# Patient Record
Sex: Male | Born: 1945 | Race: White | Hispanic: No | Smoking: Former smoker
Health system: Southern US, Community
[De-identification: ages and names within clinical notes are randomized; demographics above are authoritative.]

## PROBLEM LIST (undated history)

## (undated) DIAGNOSIS — K219 Gastro-esophageal reflux disease without esophagitis: Secondary | ICD-10-CM

## (undated) DIAGNOSIS — N400 Enlarged prostate without lower urinary tract symptoms: Secondary | ICD-10-CM

## (undated) DIAGNOSIS — Z9981 Dependence on supplemental oxygen: Secondary | ICD-10-CM

## (undated) DIAGNOSIS — I25810 Atherosclerosis of coronary artery bypass graft(s) without angina pectoris: Secondary | ICD-10-CM

## (undated) DIAGNOSIS — R69 Illness, unspecified: Secondary | ICD-10-CM

## (undated) DIAGNOSIS — I2510 Atherosclerotic heart disease of native coronary artery without angina pectoris: Secondary | ICD-10-CM

## (undated) DIAGNOSIS — R911 Solitary pulmonary nodule: Secondary | ICD-10-CM

## (undated) DIAGNOSIS — I10 Essential (primary) hypertension: Secondary | ICD-10-CM

## (undated) DIAGNOSIS — J449 Chronic obstructive pulmonary disease, unspecified: Secondary | ICD-10-CM

## (undated) DIAGNOSIS — F411 Generalized anxiety disorder: Secondary | ICD-10-CM

## (undated) DIAGNOSIS — I639 Cerebral infarction, unspecified: Secondary | ICD-10-CM

## (undated) DIAGNOSIS — I4891 Unspecified atrial fibrillation: Secondary | ICD-10-CM

## (undated) HISTORY — DX: Cerebral infarction, unspecified: I63.9

## (undated) HISTORY — DX: Atherosclerotic heart disease of native coronary artery without angina pectoris: I25.10

## (undated) HISTORY — DX: Dependence on supplemental oxygen: Z99.81

## (undated) HISTORY — DX: Gastro-esophageal reflux disease without esophagitis: K21.9

## (undated) HISTORY — DX: Essential (primary) hypertension: I10

## (undated) HISTORY — DX: Benign prostatic hyperplasia without lower urinary tract symptoms: N40.0

---

## 1994-08-11 HISTORY — PX: CORONARY ARTERY BYPASS GRAFT: SHX141

## 1999-08-12 HISTORY — PX: ARTHRODESIS: SHX136

## 2000-02-14 ENCOUNTER — Encounter: Payer: Self-pay | Admitting: Neurological Surgery

## 2000-02-18 ENCOUNTER — Inpatient Hospital Stay (HOSPITAL_COMMUNITY): Admission: RE | Admit: 2000-02-18 | Discharge: 2000-02-19 | Payer: Self-pay | Admitting: Neurological Surgery

## 2000-02-18 ENCOUNTER — Encounter: Payer: Self-pay | Admitting: Neurological Surgery

## 2000-03-12 ENCOUNTER — Encounter: Payer: Self-pay | Admitting: Neurological Surgery

## 2000-03-12 ENCOUNTER — Encounter: Admission: RE | Admit: 2000-03-12 | Discharge: 2000-03-12 | Payer: Self-pay | Admitting: Neurological Surgery

## 2000-04-17 ENCOUNTER — Encounter: Payer: Self-pay | Admitting: Neurological Surgery

## 2000-04-17 ENCOUNTER — Encounter: Admission: RE | Admit: 2000-04-17 | Discharge: 2000-04-17 | Payer: Self-pay | Admitting: Neurological Surgery

## 2000-08-11 HISTORY — PX: CORONARY ANGIOPLASTY: SHX604

## 2000-12-10 ENCOUNTER — Encounter: Payer: Self-pay | Admitting: Cardiology

## 2000-12-10 ENCOUNTER — Ambulatory Visit (HOSPITAL_COMMUNITY): Admission: RE | Admit: 2000-12-10 | Discharge: 2000-12-10 | Payer: Self-pay | Admitting: Family Medicine

## 2000-12-17 ENCOUNTER — Inpatient Hospital Stay (HOSPITAL_COMMUNITY): Admission: RE | Admit: 2000-12-17 | Discharge: 2000-12-24 | Payer: Self-pay | Admitting: *Deleted

## 2000-12-17 ENCOUNTER — Encounter: Payer: Self-pay | Admitting: *Deleted

## 2000-12-18 ENCOUNTER — Encounter: Payer: Self-pay | Admitting: Thoracic Surgery (Cardiothoracic Vascular Surgery)

## 2000-12-19 ENCOUNTER — Encounter: Payer: Self-pay | Admitting: Thoracic Surgery (Cardiothoracic Vascular Surgery)

## 2000-12-20 ENCOUNTER — Encounter: Payer: Self-pay | Admitting: Thoracic Surgery (Cardiothoracic Vascular Surgery)

## 2000-12-21 ENCOUNTER — Encounter: Payer: Self-pay | Admitting: Thoracic Surgery (Cardiothoracic Vascular Surgery)

## 2001-01-08 ENCOUNTER — Ambulatory Visit (HOSPITAL_COMMUNITY): Admission: RE | Admit: 2001-01-08 | Discharge: 2001-01-08 | Payer: Self-pay | Admitting: Cardiology

## 2001-01-08 ENCOUNTER — Encounter: Payer: Self-pay | Admitting: Cardiology

## 2001-01-14 ENCOUNTER — Encounter (HOSPITAL_COMMUNITY): Admission: RE | Admit: 2001-01-14 | Discharge: 2001-02-13 | Payer: Self-pay | Admitting: *Deleted

## 2001-01-29 ENCOUNTER — Ambulatory Visit (HOSPITAL_COMMUNITY): Admission: RE | Admit: 2001-01-29 | Discharge: 2001-01-29 | Payer: Self-pay | Admitting: Family Medicine

## 2001-01-29 ENCOUNTER — Encounter: Payer: Self-pay | Admitting: Family Medicine

## 2001-02-15 ENCOUNTER — Encounter (HOSPITAL_COMMUNITY): Admission: RE | Admit: 2001-02-15 | Discharge: 2001-03-10 | Payer: Self-pay | Admitting: *Deleted

## 2001-03-11 ENCOUNTER — Ambulatory Visit (HOSPITAL_COMMUNITY): Admission: RE | Admit: 2001-03-11 | Discharge: 2001-03-11 | Payer: Self-pay | Admitting: Cardiology

## 2001-03-11 ENCOUNTER — Encounter: Payer: Self-pay | Admitting: Cardiology

## 2001-04-08 ENCOUNTER — Ambulatory Visit (HOSPITAL_COMMUNITY): Admission: RE | Admit: 2001-04-08 | Discharge: 2001-04-08 | Payer: Self-pay | Admitting: Cardiology

## 2001-08-11 HISTORY — PX: STERNAL WIRES REMOVAL: SHX2441

## 2001-10-13 ENCOUNTER — Encounter: Payer: Self-pay | Admitting: Family Medicine

## 2001-10-13 ENCOUNTER — Ambulatory Visit (HOSPITAL_COMMUNITY): Admission: RE | Admit: 2001-10-13 | Discharge: 2001-10-13 | Payer: Self-pay | Admitting: Family Medicine

## 2001-10-25 ENCOUNTER — Encounter
Admission: RE | Admit: 2001-10-25 | Discharge: 2001-10-25 | Payer: Self-pay | Admitting: Thoracic Surgery (Cardiothoracic Vascular Surgery)

## 2001-10-25 ENCOUNTER — Encounter: Payer: Self-pay | Admitting: Thoracic Surgery (Cardiothoracic Vascular Surgery)

## 2001-10-26 ENCOUNTER — Ambulatory Visit (HOSPITAL_COMMUNITY)
Admission: RE | Admit: 2001-10-26 | Discharge: 2001-10-26 | Payer: Self-pay | Admitting: Thoracic Surgery (Cardiothoracic Vascular Surgery)

## 2002-10-21 ENCOUNTER — Ambulatory Visit (HOSPITAL_COMMUNITY): Admission: RE | Admit: 2002-10-21 | Discharge: 2002-10-21 | Payer: Self-pay | Admitting: Family Medicine

## 2002-10-21 ENCOUNTER — Encounter: Payer: Self-pay | Admitting: Family Medicine

## 2002-11-11 ENCOUNTER — Encounter: Payer: Self-pay | Admitting: Cardiology

## 2002-11-11 ENCOUNTER — Ambulatory Visit (HOSPITAL_COMMUNITY): Admission: RE | Admit: 2002-11-11 | Discharge: 2002-11-11 | Payer: Self-pay | Admitting: Cardiology

## 2002-11-28 ENCOUNTER — Encounter: Payer: Self-pay | Admitting: *Deleted

## 2002-11-28 ENCOUNTER — Encounter (HOSPITAL_COMMUNITY): Admission: RE | Admit: 2002-11-28 | Discharge: 2002-12-28 | Payer: Self-pay | Admitting: *Deleted

## 2004-01-16 ENCOUNTER — Ambulatory Visit (HOSPITAL_COMMUNITY): Admission: RE | Admit: 2004-01-16 | Discharge: 2004-01-16 | Payer: Self-pay | Admitting: Family Medicine

## 2004-08-19 ENCOUNTER — Ambulatory Visit (HOSPITAL_COMMUNITY): Admission: RE | Admit: 2004-08-19 | Discharge: 2004-08-19 | Payer: Self-pay | Admitting: Family Medicine

## 2005-03-04 ENCOUNTER — Ambulatory Visit (HOSPITAL_COMMUNITY): Admission: RE | Admit: 2005-03-04 | Discharge: 2005-03-04 | Payer: Self-pay | Admitting: Family Medicine

## 2005-03-16 ENCOUNTER — Emergency Department (HOSPITAL_COMMUNITY): Admission: EM | Admit: 2005-03-16 | Discharge: 2005-03-16 | Payer: Self-pay | Admitting: Emergency Medicine

## 2005-11-04 ENCOUNTER — Ambulatory Visit (HOSPITAL_COMMUNITY): Admission: RE | Admit: 2005-11-04 | Discharge: 2005-11-04 | Payer: Self-pay | Admitting: Family Medicine

## 2006-12-24 ENCOUNTER — Ambulatory Visit (HOSPITAL_COMMUNITY): Admission: RE | Admit: 2006-12-24 | Discharge: 2006-12-24 | Payer: Self-pay | Admitting: Podiatry

## 2007-06-25 ENCOUNTER — Ambulatory Visit: Payer: Self-pay | Admitting: Critical Care Medicine

## 2007-06-25 ENCOUNTER — Inpatient Hospital Stay (HOSPITAL_COMMUNITY): Admission: EM | Admit: 2007-06-25 | Discharge: 2007-07-01 | Payer: Self-pay | Admitting: Emergency Medicine

## 2007-06-28 ENCOUNTER — Encounter (INDEPENDENT_AMBULATORY_CARE_PROVIDER_SITE_OTHER): Payer: Self-pay | Admitting: *Deleted

## 2007-06-28 ENCOUNTER — Encounter: Payer: Self-pay | Admitting: Pulmonary Disease

## 2007-07-14 ENCOUNTER — Ambulatory Visit: Payer: Self-pay | Admitting: Pulmonary Disease

## 2007-08-17 ENCOUNTER — Ambulatory Visit: Payer: Self-pay | Admitting: Pulmonary Disease

## 2007-08-17 DIAGNOSIS — I2589 Other forms of chronic ischemic heart disease: Secondary | ICD-10-CM | POA: Insufficient documentation

## 2007-08-17 DIAGNOSIS — J449 Chronic obstructive pulmonary disease, unspecified: Secondary | ICD-10-CM

## 2007-08-17 DIAGNOSIS — R69 Illness, unspecified: Secondary | ICD-10-CM | POA: Insufficient documentation

## 2007-08-17 DIAGNOSIS — J4489 Other specified chronic obstructive pulmonary disease: Secondary | ICD-10-CM | POA: Insufficient documentation

## 2007-08-17 DIAGNOSIS — J984 Other disorders of lung: Secondary | ICD-10-CM

## 2007-08-17 HISTORY — DX: Chronic obstructive pulmonary disease, unspecified: J44.9

## 2007-08-17 HISTORY — DX: Illness, unspecified: R69

## 2008-09-01 ENCOUNTER — Ambulatory Visit: Payer: Self-pay | Admitting: Cardiovascular Disease

## 2008-09-01 ENCOUNTER — Inpatient Hospital Stay (HOSPITAL_COMMUNITY): Admission: EM | Admit: 2008-09-01 | Discharge: 2008-09-11 | Payer: Self-pay | Admitting: Emergency Medicine

## 2008-09-01 ENCOUNTER — Ambulatory Visit: Payer: Self-pay | Admitting: Critical Care Medicine

## 2008-09-03 ENCOUNTER — Encounter: Payer: Self-pay | Admitting: Cardiology

## 2008-09-12 ENCOUNTER — Telehealth (INDEPENDENT_AMBULATORY_CARE_PROVIDER_SITE_OTHER): Payer: Self-pay | Admitting: *Deleted

## 2008-09-25 ENCOUNTER — Ambulatory Visit: Payer: Self-pay | Admitting: Cardiology

## 2008-10-02 ENCOUNTER — Ambulatory Visit: Payer: Self-pay | Admitting: Cardiology

## 2008-10-25 ENCOUNTER — Ambulatory Visit: Payer: Self-pay | Admitting: Cardiology

## 2008-11-21 ENCOUNTER — Ambulatory Visit: Payer: Self-pay | Admitting: Pulmonary Disease

## 2008-12-26 ENCOUNTER — Ambulatory Visit: Payer: Self-pay | Admitting: Internal Medicine

## 2009-01-23 ENCOUNTER — Ambulatory Visit: Payer: Self-pay | Admitting: Cardiology

## 2009-03-21 ENCOUNTER — Ambulatory Visit: Payer: Self-pay | Admitting: Pulmonary Disease

## 2009-06-29 ENCOUNTER — Ambulatory Visit: Payer: Self-pay | Admitting: Cardiology

## 2009-07-03 ENCOUNTER — Ambulatory Visit: Payer: Self-pay | Admitting: Pulmonary Disease

## 2009-08-20 ENCOUNTER — Ambulatory Visit: Payer: Self-pay | Admitting: Cardiology

## 2009-08-20 DIAGNOSIS — I25810 Atherosclerosis of coronary artery bypass graft(s) without angina pectoris: Secondary | ICD-10-CM | POA: Insufficient documentation

## 2009-08-20 DIAGNOSIS — E785 Hyperlipidemia, unspecified: Secondary | ICD-10-CM | POA: Insufficient documentation

## 2009-08-20 DIAGNOSIS — I10 Essential (primary) hypertension: Secondary | ICD-10-CM | POA: Insufficient documentation

## 2009-08-20 HISTORY — DX: Atherosclerosis of coronary artery bypass graft(s) without angina pectoris: I25.810

## 2009-08-22 ENCOUNTER — Encounter (INDEPENDENT_AMBULATORY_CARE_PROVIDER_SITE_OTHER): Payer: Self-pay | Admitting: *Deleted

## 2009-08-22 ENCOUNTER — Encounter: Payer: Self-pay | Admitting: Adult Health

## 2009-08-22 LAB — CONVERTED CEMR LAB
ALT: 17 units/L
AST: 21 units/L (ref 0–37)
Albumin: 4.2 g/dL (ref 3.5–5.2)
Bilirubin, Direct: 0.2 mg/dL
CO2: 25 meq/L
CO2: 25 meq/L (ref 19–32)
Calcium: 9 mg/dL (ref 8.4–10.5)
Chloride: 104 meq/L (ref 96–112)
Cholesterol: 139 mg/dL
Creatinine, Ser: 0.93 mg/dL
HDL: 57 mg/dL (ref >39)
LDL Cholesterol: 73 mg/dL
LDL Cholesterol: 73 mg/dL (ref 0–99)
Potassium: 4.2 meq/L (ref 3.5–5.3)
Sodium: 140 meq/L (ref 135–145)
Total Bilirubin: 0.8 mg/dL (ref 0.3–1.2)
Total CHOL/HDL Ratio: 2.4
VLDL: 9 mg/dL (ref 0–40)

## 2009-08-23 ENCOUNTER — Encounter (INDEPENDENT_AMBULATORY_CARE_PROVIDER_SITE_OTHER): Payer: Self-pay | Admitting: *Deleted

## 2009-08-24 ENCOUNTER — Encounter: Payer: Self-pay | Admitting: Adult Health

## 2009-11-01 ENCOUNTER — Ambulatory Visit: Payer: Self-pay | Admitting: Pulmonary Disease

## 2009-12-31 ENCOUNTER — Ambulatory Visit: Payer: Self-pay | Admitting: Cardiology

## 2010-01-03 ENCOUNTER — Ambulatory Visit: Payer: Self-pay | Admitting: Pulmonary Disease

## 2010-01-10 ENCOUNTER — Telehealth (INDEPENDENT_AMBULATORY_CARE_PROVIDER_SITE_OTHER): Payer: Self-pay | Admitting: *Deleted

## 2010-02-19 ENCOUNTER — Encounter (HOSPITAL_COMMUNITY): Admission: RE | Admit: 2010-02-19 | Discharge: 2010-03-21 | Payer: Self-pay | Admitting: Pulmonary Disease

## 2010-02-19 ENCOUNTER — Encounter (HOSPITAL_COMMUNITY): Admission: RE | Admit: 2010-02-19 | Discharge: 2010-03-26 | Payer: Self-pay | Admitting: Pediatrics

## 2010-03-14 ENCOUNTER — Ambulatory Visit: Payer: Self-pay | Admitting: Pulmonary Disease

## 2010-03-19 ENCOUNTER — Encounter (INDEPENDENT_AMBULATORY_CARE_PROVIDER_SITE_OTHER): Payer: Self-pay | Admitting: *Deleted

## 2010-03-25 ENCOUNTER — Telehealth: Payer: Self-pay | Admitting: Adult Health

## 2010-03-25 ENCOUNTER — Ambulatory Visit: Payer: Self-pay | Admitting: Cardiology

## 2010-03-27 ENCOUNTER — Encounter (HOSPITAL_COMMUNITY): Admission: RE | Admit: 2010-03-27 | Discharge: 2010-04-26 | Payer: Self-pay | Admitting: Pulmonary Disease

## 2010-04-26 ENCOUNTER — Encounter (HOSPITAL_COMMUNITY)
Admission: RE | Admit: 2010-04-26 | Discharge: 2010-05-26 | Payer: Self-pay | Source: Home / Self Care | Admitting: Pulmonary Disease

## 2010-05-06 ENCOUNTER — Encounter: Payer: Self-pay | Admitting: Pulmonary Disease

## 2010-06-21 ENCOUNTER — Telehealth: Payer: Self-pay | Admitting: Pulmonary Disease

## 2010-06-27 ENCOUNTER — Ambulatory Visit: Payer: Self-pay | Admitting: Internal Medicine

## 2010-06-27 ENCOUNTER — Ambulatory Visit: Payer: Self-pay | Admitting: Cardiovascular Disease

## 2010-06-27 ENCOUNTER — Inpatient Hospital Stay (HOSPITAL_COMMUNITY)
Admission: EM | Admit: 2010-06-27 | Discharge: 2010-07-08 | Payer: Self-pay | Source: Home / Self Care | Admitting: Emergency Medicine

## 2010-06-28 ENCOUNTER — Encounter (INDEPENDENT_AMBULATORY_CARE_PROVIDER_SITE_OTHER): Payer: Self-pay | Admitting: Internal Medicine

## 2010-07-01 ENCOUNTER — Encounter: Payer: Self-pay | Admitting: Pulmonary Disease

## 2010-07-10 ENCOUNTER — Telehealth: Payer: Self-pay | Admitting: Pulmonary Disease

## 2010-07-15 ENCOUNTER — Ambulatory Visit: Payer: Self-pay | Admitting: Pulmonary Disease

## 2010-07-16 ENCOUNTER — Ambulatory Visit: Payer: Self-pay | Admitting: Cardiology

## 2010-07-16 DIAGNOSIS — I4891 Unspecified atrial fibrillation: Secondary | ICD-10-CM | POA: Insufficient documentation

## 2010-07-16 HISTORY — DX: Unspecified atrial fibrillation: I48.91

## 2010-07-17 ENCOUNTER — Telehealth: Payer: Self-pay | Admitting: Pulmonary Disease

## 2010-07-19 ENCOUNTER — Encounter: Payer: Self-pay | Admitting: Pulmonary Disease

## 2010-07-22 ENCOUNTER — Encounter: Payer: Self-pay | Admitting: Pulmonary Disease

## 2010-07-22 ENCOUNTER — Ambulatory Visit (HOSPITAL_COMMUNITY)
Admission: RE | Admit: 2010-07-22 | Discharge: 2010-07-22 | Payer: Self-pay | Source: Home / Self Care | Attending: Pulmonary Disease | Admitting: Pulmonary Disease

## 2010-07-22 ENCOUNTER — Ambulatory Visit
Admission: RE | Admit: 2010-07-22 | Discharge: 2010-07-22 | Payer: Self-pay | Source: Home / Self Care | Admitting: Pulmonary Disease

## 2010-07-25 ENCOUNTER — Ambulatory Visit: Payer: Self-pay | Admitting: Internal Medicine

## 2010-08-01 ENCOUNTER — Ambulatory Visit: Payer: Self-pay | Admitting: Pulmonary Disease

## 2010-08-14 ENCOUNTER — Ambulatory Visit: Admit: 2010-08-14 | Payer: Self-pay | Admitting: Pulmonary Disease

## 2010-08-31 ENCOUNTER — Encounter: Payer: Self-pay | Admitting: Pulmonary Disease

## 2010-09-01 ENCOUNTER — Encounter: Payer: Self-pay | Admitting: Pulmonary Disease

## 2010-09-10 NOTE — Miscellaneous (Signed)
Summary: LABS BMP,LIPIDS,LIVER1/07/2010  Clinical Lists Changes  Observations: Added new observation of CALCIUM: 9.0 mg/dL (42/70/6237 62:83) Added new observation of ALBUMIN: 4.2 g/dL (15/17/6160 73:71) Added new observation of PROTEIN, TOT: 6.5 g/dL (02/04/9484 46:27) Added new observation of SGPT (ALT): 17 units/L (08/22/2009 15:41) Added new observation of SGOT (AST): 21 units/L (08/22/2009 15:41) Added new observation of ALK PHOS: 83 units/L (08/22/2009 15:41) Added new observation of BILI DIRECT: 0.2 mg/dL (03/50/0938 18:29) Added new observation of CREATININE: 0.93 mg/dL (93/71/6967 89:38) Added new observation of BUN: 22 mg/dL (05/27/5101 58:52) Added new observation of BG RANDOM: 89 mg/dL (77/82/4235 36:14) Added new observation of CO2 PLSM/SER: 25 meq/L (08/22/2009 15:41) Added new observation of CL SERUM: 104 meq/L (08/22/2009 15:41) Added new observation of K SERUM: 4.2 meq/L (08/22/2009 15:41) Added new observation of NA: 140 meq/L (08/22/2009 15:41) Added new observation of LDL: 73 mg/dL (43/15/4008 67:61) Added new observation of HDL: 57 mg/dL (95/04/3266 12:45) Added new observation of TRIGLYC TOT: 46 mg/dL (80/99/8338 25:05) Added new observation of CHOLESTEROL: 139 mg/dL (39/76/7341 93:79)

## 2010-09-10 NOTE — Miscellaneous (Signed)
Summary: Progress Report/[facility]  Progress Report/[facility]   Imported By: Sherian Rein 06/03/2010 16:17:23  _____________________________________________________________________  External Attachment:    Type:   Image     Comment:   External Document

## 2010-09-10 NOTE — Assessment & Plan Note (Signed)
Summary: 6 mth f/u per checkout on 01/23/09/tg    Primary Provider:  Patrica Velazquez   History of Present Illness: [name] is a  65 y/o CM we are following with known history of CAD, CABG 2004,follow up cath 08/2008 with patent grafts, COPD (O2 dependent) for 6 month evaluation.  He is without complaint of chest pain.  He has chronic SOB, but no worsening.  He is remaining as active as he can be.  He recently finished painting his basement, which took him almost one year to complete.  He worked on it a couple of hours each day and would stop when he fatigued.  He has no complaints of leg cramping, or muscle fatigue on current medications.  Current Problems (verified): 1)  Pulmonary Nodule, Right Upper Lobe  (ICD-518.89) 2)  Hypoxemia Hypoxia Hypoxic  (ICD-799) 3)  Coronary Artery Disease  (ICD-414) 4)  Chronic Obstructive Pulmonary Disease  (ICD-496)  Allergies: 1)  ! Azithromycin (Azithromycin) 2)  ! Hydrocodone  Past History:  Past medical, surgical, family and social histories (including risk factors) reviewed, and no changes noted (except as noted below).  Past Medical History: Reviewed history from 11/21/2008 and no changes required. 1. Home oxygen since 2006. 2. Coronary artery disease status post CABG in 1996. 3. Catheterization in 2002 showing decreased LV function and 2-vessel   coronary artery disease. rpt cath 1/10 nml LVSF, patent grafts 4. Hypertension. 5. Diabetes. 6. Right-sided CVA requiring TPA in 2003. 7. Benign prostatic hypertrophy. 8. GERD.  Past Surgical History: Reviewed history from 11/21/2008 and no changes required. CABG '96] Cervical disk disease with arthrodesis in 2001 by Dr. Danielle Dess. Removal of sternal wires 2003.  Family History: Reviewed history from 01/17/2009 and no changes required. Father:deceased chf 32 yo Mother:deceased 83 yo unknw Siblings:1 brother                2 sisters   Social History: Reviewed history from 07/03/2009 and no  changes required. He has smoked as much as 5 packs per day, for the last few years he was smoking about a pack per day and has quit since hospital admission 12/08. 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  Divorced  children 1   Review of Systems       The patient complains of dyspnea on exertion.         All other systems have been reviewed and are negative unless stated above.   Physical Exam  General:  Well developed, well nourished, in no acute distress. Head:  normocephalic and atraumatic Eyes:  PERRLA/EOM intact; conjunctiva and lids normal. Nose:  no deformity, discharge, inflammation, or lesions Lungs:  Mild crackles, with diminished breath sounds bibasilar. Barrel chest is noted. Heart:  Non-displaced PMI, chest non-tender; regular rate and rhythm, S1, S2 without murmurs, rubs or gallops. Carotid upstroke normal,mild r bruitt. Normal abdominal aortic size, no bruits. Femorals normal pulses, no bruits. Pedals normal pulses. No edema, no varicosities. Abdomen:  Bowel sounds positive; abdomen soft and non-tender without masses, organomegaly, or hernias noted. No hepatosplenomegaly. Msk:  Kyphosis noted. No scoliosis. Extremities:  No clubbing or cyanosis. Neurologic:  Alert and oriented x 3. Psych:  Normal affect.   Impression & Recommendations:  Problem # 1:  CORONARY ARTERY DISEASE (ICD-414) Assessment Unchanged He is asympytomatic.  I will check BMET for kidney fx as he has been on lasix and K-Dur and as not had this checked for almost a year.,  Problem # 2:  CHRONIC OBSTRUCTIVE PULMONARY DISEASE (ICD-496) Assessment: Comment Only Review of recent  CT scan dated 06/29/2009 shows New 7mm nodule.  Recommendations for f/u CT in 6 months.  Will defer to primary card physician.  COPD and emphysema is noted. His updated medication list for this problem includes:    Duoneb 2.5-0.5 Mg/78ml Soln (Albuterol-ipratropium) ..... Inhale 1 vial via hhn 4 times daily    Advair  Diskus 250-50 Mcg/dose Aepb (Fluticasone-salmeterol) .Marland Kitchen... 1 puff two times a day  Problem # 3:  HYPERLIPIDEMIA (ICD-272.4) Assessment: Comment Only Check lipids and LFT's with BMET to evaluate  status. His updated medication list for this problem includes:    Simvastatin 40 Mg Tabs (Simvastatin) .Marland Kitchen... 1 by mouth at bedtime

## 2010-09-10 NOTE — Letter (Signed)
Summary: Medication List/[facility]  Medication List/   Imported By: Sherian Rein 07/17/2010 08:04:30  _____________________________________________________________________  External Attachment:    Type:   Image     Comment:   External Document

## 2010-09-10 NOTE — Miscellaneous (Signed)
Summary: Pulmonary Rehab/[place]  Pulmonary Rehab/[place]   Imported By: Sherian Rein 07/17/2010 08:02:47  _____________________________________________________________________  External Attachment:    Type:   Image     Comment:   External Document

## 2010-09-10 NOTE — Assessment & Plan Note (Signed)
Summary: [name] 4 months///kp   Primary Provider/Referring Provider:  Patrica Duel  CC:  4 month follow up .  History of Present Illness: 30 /M, ex-smoker for FU of  severe COPD on home O2 since 2006. Pulmonary nodules have appeared & resolved on serial CTs since 11/08 CT of chest 11/08 did not show any evidence of pulmonary embolism but showed a spiculated nodule in the right upper lobe 1.2 x 1.6 cm.   July 03, 2009 1:46 PM  has been doing work and chores around the house,  his breathing has done well during these activities. not taking spiriva . Using neb  four times a day. Is very  active at home, does small jobs around house, rests frequently.  Reviewed CT chest 06/29/09 >> RLL nodule resolved, but new RUL 7 mm nodule.   November 01, 2009--Presents for 4 month follow. Doing well except his allergies acted up more yesterday d/t mowing grass. He still remains very active, does not like to sit still. He has upcoming CT chest in May of this year to follow up new RUL nodule found on CT 11/10. Denies chest pain, dyspnea, orthopnea, hemoptysis, fever, n/v/d, edema, headache.   Current Medications (verified): 1)  Bayer Aspirin 325 Mg Tabs (Aspirin) .Marland Kitchen.. 1 By Mouth Once Daily 2)  Duoneb 2.5-0.5 Mg/58ml  Soln (Albuterol-Ipratropium) .... Inhale 1 Vial Via Hhn 4 Times Daily 3)  Klor-Con 10 10 Meq Cr-Tabs (Potassium Chloride) .Marland Kitchen.. 1 By Mouth With Food Once Daily As Needed When Taking Furosemide 4)  Furosemide 20 Mg Tabs (Furosemide) .Marland Kitchen.. 1 By Mouth Once Daily As Needed 5)  Ropinirole Hcl 1 Mg Tabs (Ropinirole Hcl) .Marland Kitchen.. 1 By Mouth At Bedtime 6)  Diazepam 10 Mg Tabs (Diazepam) .... 1/2 To 1 Tablet Every 8 Hours As Needed 7)  Simvastatin 40 Mg Tabs (Simvastatin) .Marland Kitchen.. 1 By Mouth At Bedtime 8)  Nitrostat 0.4 Mg Subl (Nitroglycerin) .Marland Kitchen.. 1 Tablet Under Tongue At Onset of Chest Pain; You May Repeat Every 5 Minutes For Up To 3 Doses. 9)  Isosorbide Mononitrate Cr 60 Mg Xr24h-Tab (Isosorbide Mononitrate) ....  2 By Mouth Once Daily 10)  Protonix 40 Mg Tbec (Pantoprazole Sodium) .Marland Kitchen.. 1 By Mouth Two Times A Day 11)  Advair Diskus 250-50 Mcg/dose Aepb (Fluticasone-Salmeterol) .Marland Kitchen.. 1 Puff Two Times A Day  Allergies (verified): 1)  ! Azithromycin (Azithromycin) 2)  ! Hydrocodone  Past History:  Past Medical History: Last updated: 11/21/2008 1. Home oxygen since 2006. 2. Coronary artery disease status post CABG in 1996. 3. Catheterization in 2002 showing decreased LV function and 2-vessel   coronary artery disease. rpt cath 1/10 nml LVSF, patent grafts 4. Hypertension. 5. Diabetes. 6. Right-sided CVA requiring TPA in 2003. 7. Benign prostatic hypertrophy. 8. GERD.  Past Surgical History: Last updated: 11/21/2008 CABG '96] Cervical disk disease with arthrodesis in 2001 by Dr. Danielle Dess. Removal of sternal wires 2003.  Family History: Last updated: Jan 29, 2009 Father:deceased chf 2 yo Mother:deceased 64 yo unknw Siblings:1 brother                2 sisters   Social History: Last updated: 07/03/2009 He has smoked as much as 5 packs per day, for the last few years he was smoking about a pack per day and has quit since hospital admission 12/08. 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 PFTs 1/09 >> FEV1 0.64 (21%), ratio 22, DLCO 35% 06/2009--CT chest New 7 mm nodule in the right upper lobe - recommend follow-up CT in 6 months.  Resolved previously noted right lower lobe nodule.  Review of Systems      See HPI  Vital Signs:  Patient profile:   65 year old male Height:      69 inches Weight:      151 pounds O2 Sat:      93 % on 2 L/min Temp:     97.3 degrees F oral Pulse rate:   89 / minute BP sitting:   114 / 70  (left arm)  Vitals Entered By: Renold Genta RCP, LPN (November 01, 2009 11:06  AM)  O2 Sat at Rest %:  93% O2 Flow:  2 L/min CC: 4 month follow up  Comments Medications reviewed with patient Renold Genta RCP, LPN  November 01, 2009 11:06 AM    Physical Exam  Additional Exam:  GEN: A/Ox3; pleasant , NAD HEENT:  [place]/AT, , EACs-clear, TMs-wnl, NOSE-clear, THROAT-clear NECK:  Supple w/ fair ROM; no JVD; normal carotid impulses w/o bruits; no thyromegaly or nodules palpated; no lymphadenopathy. RESP  Clear to P & A , decreased in bases  CARD:  RRR, no m/r/g   [place]: Warm bil,  no calf tenderness edema, clubbing, pulses intact    Impression & Recommendations:  Problem # 1:  CHRONIC OBSTRUCTIVE PULMONARY DISEASE (ICD-496) Compensated on present regimen.  REC:  follow up Dr. Vassie Loll after CT scan in 2 months Continue on same meds.  Please contact office for sooner as needed   Problem # 2:  PULMONARY NODULE, RIGHT UPPER LOBE (ICD-518.89)  follow up Dr. Vassie Loll after CT scan in 2 months Continue on same meds.  Please contact office for sooner as needed   Orders: Est. Patient Level II (95638)  Complete Medication List: 1)  Bayer Aspirin 325 Mg Tabs (Aspirin) .Marland Kitchen.. 1 by mouth once daily 2)  Duoneb 2.5-0.5 Mg/62ml Soln (Albuterol-ipratropium) .... Inhale 1 vial via hhn 4 times daily 3)  Klor-con 10 10 Meq Cr-tabs (Potassium chloride) .Marland Kitchen.. 1 by mouth with food once daily as needed when taking furosemide 4)  Furosemide 20 Mg Tabs (Furosemide) .Marland Kitchen.. 1 by mouth once daily as needed 5)  Ropinirole Hcl 1 Mg Tabs (Ropinirole hcl) .Marland Kitchen.. 1 by mouth at bedtime 6)  Diazepam 10 Mg Tabs (Diazepam) .... 1/2 to 1 tablet every 8 hours as needed 7)  Simvastatin 40 Mg Tabs (Simvastatin) .Marland Kitchen.. 1 by mouth at bedtime 8)  Nitrostat 0.4 Mg Subl (Nitroglycerin) .Marland Kitchen.. 1 tablet under tongue at onset of chest pain; you may repeat every 5 minutes for up to 3 doses. 9)  Isosorbide Mononitrate Cr 60 Mg Xr24h-tab (Isosorbide mononitrate) .... 2 by mouth once daily 10)  Protonix 40 Mg Tbec (Pantoprazole  sodium) .Marland Kitchen.. 1 by mouth two times a day 11)  Advair Diskus 250-50 Mcg/dose Aepb (Fluticasone-salmeterol) .Marland Kitchen.. 1 puff two times a day  Patient Instructions: 1)  follow up Dr. Vassie Loll after CT scan in 2 months 2)  Continue on same meds.  3)  Please contact office for sooner as needed

## 2010-09-10 NOTE — Progress Notes (Signed)
Summary: pt has heard feom dme  Phone Note Call from Patient Call back at [facility] Phone [phone]   Caller: Patient Call For: alva Reason for Call: Talk to Nurse Summary of Call: [facility] is saying they haven't rec'd anything on pt.  Can you please check on this? Initial call taken by: Eugene Gavia,  January 10, 2010 11:07 AM  Follow-up for Phone Call        [facility]ed to [facility] 01/03/10 spoke to pt, pt aware i will refax order again and also called [facility] Follow-up by: Oneita Jolly,  January 10, 2010 11:37 AM

## 2010-09-10 NOTE — Assessment & Plan Note (Signed)
Summary: eph/rm  Medications Added DIAZEPAM 10 MG TABS (DIAZEPAM) 1 tablet every 8 hours as needed SIMVASTATIN 40 MG TABS (SIMVASTATIN) 1 by mouth at bedtime ISOSORBIDE MONONITRATE CR 60 MG XR24H-TAB (ISOSORBIDE MONONITRATE) 2 by mouth once daily DILTIAZEM HCL 120 MG TABS (DILTIAZEM HCL) Take 1 tablet by mouth once a day PREDNISONE 10 MG TABS (PREDNISONE) take 1 tab daily IPRATROPIUM BROMIDE 0.02 % SOLN (IPRATROPIUM BROMIDE) use as needed      Allergies Added:   Visit Type:  Follow-up Primary Provider:  [facility]  CC:  post hospital.  History of Present Illness: [name] comes in today for close followup after being hospitalized for acute on chronic COPD exacerbation.  He required intubation. He went from sinus tachycardia to atrial fibrillation with a rapid ventricular rate. He was slowed with diltiazem. Echocardiogram showed good left ventricular function with an EF of 60%. He was anticoagulated but then developed hemoptysis making Coumadin contraindicated.  His discharge time and had followup with pulmonary yesterday. He is scheduled for a followup CT scan.  He has a history of coronary bypass grafting. He's done remarkably well in regard to that.  He's had no further symptoms of atrial fibrillation. He is generally in sinus tachycardia because of his severe COPD.  Hospital records, laboratory data, echocardiography all reviewed with patient. His sister is with him today.    Current Medications (verified): 1)  Aspirin 81 Mg Tbec (Aspirin) .... Take 1 Tablet By Mouth Once A Day 2)  Duoneb 2.5-0.5 Mg/39ml  Soln (Albuterol-Ipratropium) .... Inhale 1 Vial Via Hhn 4 Times Daily 3)  Klor-Con 10 10 Meq Cr-Tabs (Potassium Chloride) .Marland Kitchen.. 1 By Mouth With Food Once Daily As Needed When Taking Furosemide 4)  Furosemide 20 Mg Tabs (Furosemide) .Marland Kitchen.. 1 By Mouth Once Daily As Needed 5)  Ropinirole Hcl 1 Mg Tabs (Ropinirole Hcl) .Marland Kitchen.. 1 By Mouth At Bedtime 6)  Diazepam 10 Mg Tabs  (Diazepam) .Marland Kitchen.. 1 Tablet Every 8 Hours As Needed 7)  Simvastatin 40 Mg Tabs (Simvastatin) .Marland Kitchen.. 1 By Mouth At Bedtime 8)  Nitrostat 0.4 Mg Subl (Nitroglycerin) .Marland Kitchen.. 1 Tablet Under Tongue At Onset of Chest Pain; You May Repeat Every 5 Minutes For Up To 3 Doses. 9)  Isosorbide Mononitrate Cr 60 Mg Xr24h-Tab (Isosorbide Mononitrate) .... 2 By Mouth Once Daily 10)  Protonix 40 Mg Tbec (Pantoprazole Sodium) .Marland Kitchen.. 1 By Mouth Two Times A Day 11)  Advair Diskus 250-50 Mcg/dose Aepb (Fluticasone-Salmeterol) .... One Puff Twice Daily 12)  Diltiazem Hcl 120 Mg Tabs (Diltiazem Hcl) .... Take 1 Tablet By Mouth Once A Day 13)  Prednisone 10 Mg Tabs (Prednisone) .... Take 1 Tab Daily 14)  Ipratropium Bromide 0.02 % Soln (Ipratropium Bromide) .... Use As Needed  Allergies (verified): 1)  ! Azithromycin (Azithromycin) 2)  ! Hydrocodone  Comments:  Nurse/Medical Assistant: patient was on coumadin in the hospital so we need to reevaluate per patient  states he is not taking it now patient [facility] new [facility].brought med list from hospital discharge  Past History:  Past Medical History: Last updated: 11/21/2008 1. Home oxygen since 2006. 2. Coronary artery disease status post CABG in 1996. 3. Catheterization in 2002 showing decreased LV function and 2-vessel   coronary artery disease. rpt cath 1/10 nml LVSF, patent grafts 4. Hypertension. 5. Diabetes. 6. Right-sided CVA requiring TPA in 2003. 7. Benign prostatic hypertrophy. 8. GERD.  Past Surgical History: Last updated: 11/21/2008 CABG '96] Cervical disk disease with arthrodesis in  2001 by Dr. Danielle Dess. Removal of sternal wires 2003.  Family History: Last updated: 01/19/09 Father:deceased chf 20 yo Mother:deceased 43 yo unknw Siblings:1 brother                2 sisters   Social History: Last updated: 07/03/2009 He has smoked as much as 5 packs per day, for the last few years he was smoking about a pack  per day and has quit since hospital admission 12/08. 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 Neck:  Neck supple, no JVD. No masses, thyromegaly or abnormal cervical nodes. Chest [name]:  no deformities or breast masses noted Lungs:  decreased breath sounds throughout without rhonchi or wheezes Heart:  PMI nondisplaced soft S1-S2 regular rate and rhythm Abdomen:  soft, nondistended, good bowel sounds Msk:  Back normal, normal gait. Muscle strength and tone normal. Pulses:  pulses normal in all 4 extremities Extremities:  varicose veins, no sign of DVT, no significant edema Neurologic:  Alert and oriented x 3. Skin:  Intact without lesions or rashes. Psych:  anxious.     Problems:  Medical Problems Added: 1)  Dx of Atrial Fibrillation  (ICD-427.31)  Impression & Recommendations:  Problem # 1:  CORONARY ATHEROSCLEROSIS OF ARTERY BYPASS GRAFT (ICD-414.04) Assessment Unchanged  His updated medication list for this problem includes:    Aspirin 81 Mg Tbec (Aspirin) .Marland Kitchen... Take 1 tablet by mouth once a day    Nitrostat 0.4 Mg Subl (Nitroglycerin) .Marland Kitchen... 1 tablet under tongue at onset of chest pain; you may repeat every 5 minutes for up to 3 doses.    Isosorbide Mononitrate Cr 60 Mg Xr24h-tab (Isosorbide mononitrate) .Marland Kitchen... 2 by mouth once daily    Diltiazem Hcl 120 Mg Tabs (Diltiazem hcl) .Marland Kitchen... Take 1 tablet by  mouth once a day  Problem # 2:  HYPERTENSION (ICD-401.9) Assessment: Improved  His updated medication list for this problem includes:    Aspirin 81 Mg Tbec (Aspirin) .Marland Kitchen... Take 1 tablet by mouth once a day    Furosemide 20 Mg Tabs (Furosemide) .Marland Kitchen... 1 by mouth once daily as needed    Diltiazem Hcl 120 Mg Tabs (Diltiazem hcl) .Marland Kitchen... Take 1 tablet by mouth once a day  Problem # 3:  ATRIAL FIBRILLATION (ICD-427.31) Assessment: Improved His arrhythmia has occurred post bypass years ago and then this acute infection and COPD exacerbation. Because of hemoptysis, she continues to have a little bit of, we will not prescribe Coumadin. We will continue with aspirin. I'll see him back for close followup in March. His updated medication list for this problem includes:    Aspirin 81 Mg Tbec (Aspirin) .Marland Kitchen... Take 1 tablet by mouth once a day  Patient Instructions: 1)  Your physician recommends that you schedule a  follow-up appointment in: 3 months 2)  Your physician recommends that you continue on your current medications as directed. Please refer to the Current Medication list given to you today. Prescriptions: DILTIAZEM HCL 120 MG TABS (DILTIAZEM HCL) Take 1 tablet by mouth once a day  #90 x 1   Entered by:   [name] Via LPN   Authorized by:   Gaylord Shih, MD, [facility]   Signed by:   [name] Via LPN on 16/05/9603   Method used:   Faxed to ...       [facility] (mail-order)             , [place]         Ph: [phone]       Fax: [phone]   RxID:   [id number] SIMVASTATIN 40 MG TABS (SIMVASTATIN) 1 by mouth at bedtime  #90 x 1   Entered by:   [name] Via LPN   Authorized by:   Gaylord Shih, MD, [facility]   Signed by:   [name] Via LPN on 32/44/0102   Method used:   Faxed to ...       [facility] (mail-order)             , [place]         Ph: [phone]       Fax: [phone]   RxID:   [id number] ISOSORBIDE MONONITRATE CR 60 MG XR24H-TAB (ISOSORBIDE MONONITRATE) 2 by mouth once daily  #180 x 1   Entered by:    [name] Via LPN   Authorized by:   Gaylord Shih, MD, [facility]   Signed by:   [name] Via LPN on 02/08/1600   Method used:   Faxed to ...       [facility] (mail-order)             , [place]         Ph: [phone]       Fax: [phone]   RxID:   [id number]

## 2010-09-10 NOTE — Assessment & Plan Note (Signed)
Summary: NP follow up - post hosp   Primary Provider/Referring Provider:  [facility]  CC:  post hosp follow up - states breathing is doing well.  no new complaints..  History of Present Illness: 65 /M, ex-smoker for FU of  severe COPD on home O2 since 2006. Pulmonary nodules have appeared & resolved on serial CTs since 11/08 CT of chest 11/08 did not show any evidence of pulmonary embolism but showed a spiculated nodule in the right upper lobe 1.2 x 1.6 cm.  CT chest 06/29/09 >> RLL nodule resolved, but new RUL 7 mm nodule. CTc hest 12/31/09 >> RUL nodule resolved, new 10 mm nodule LUL   Jan 03, 2010 11:17 AM  has been doing work and chores around the house,  his breathing has done well during these activities. not taking spiriva . Using neb  four times a day. Is very  active at home, does small jobs around house, rests frequently. reviewed CT chest, wants portable concentrator. Compliant with O2 x 24h Desaturates to 87% on second lap.   March 14, 2010--Presents for follow up - states is doing well since last OV.  no new complaints.  pt began pulm rehab 1 month ago. He feels he is better w/ rehab, using treadmill and bike. He is upset today b/c he got to seperate bills from last CT scan. He wants to try Symbicort instead of Advair. Saw advertisement on TV and thinks advair causes him a sore throat. We discuused several options w/ calling billing at [facility] and calling number on bill.  Denies chest pain, orthopnea, hemoptysis, fever, n/v/d, edema, headache.    July 15, 2010--Pt presents for a post hosp follow up. Says his  breathing is doing well.   Has been under going PT and is doing better  not using walker at times. Has a few days left on steroids. Admitted 11/17-11/28/11 for Acute hypercarbic on chronic respiratory failure with underlying  COPD.   Admitted with progressively worsening respiratory distress requiring intubation  and mechanical ventilation.  He was successfully  liberated from the ventilator on November 19.   He was empirically treated for community-acquired  pneumonia without obvious infiltrate on chest film.  He was quickly weaned back to his baseline of 2 liters per O2. Discharged on  a prednisone taper, which is to be    completed over 2 weeks.  Cardiology was consulted in the setting of atrial fibrillation  He did transition to sinus rhythm fairly quickly and during hospital course, did have repeat episodes of paroxysmal  atrial fibrillation with quick resolution and returned to sinus rhythm.  Unfortunately had hemoptysis in the setting of Coumadin/heparin administration during the hospital course and at the time of discharge, he was in sinus rhythm and will not be continued on anticoagulants. Denies chest pain,  orthopnea, hemoptysis, fever, n/v/d, edema, headache,    Medications Prior to Update: 1)  Bayer Aspirin 325 Mg Tabs (Aspirin) .Marland Kitchen.. 1 By Mouth Once Daily 2)  Duoneb 2.5-0.5 Mg/57ml  Soln (Albuterol-Ipratropium) .... Inhale 1 Vial Via Hhn 4 Times Daily 3)  Klor-Con 10 10 Meq Cr-Tabs (Potassium Chloride) .Marland Kitchen.. 1 By Mouth With Food Once Daily As Needed When Taking Furosemide 4)  Furosemide 20 Mg Tabs (Furosemide) .Marland Kitchen.. 1 By Mouth Once Daily As Needed 5)  Ropinirole Hcl 1 Mg Tabs (Ropinirole Hcl) .Marland Kitchen.. 1 By Mouth At Bedtime 6)  Diazepam 10 Mg Tabs (Diazepam) .... 1/2 To 1 Tablet Every 8 Hours As Needed 7)  Simvastatin 40 Mg Tabs (Simvastatin) .Marland Kitchen.. 1 By Mouth At Bedtime 8)  Nitrostat 0.4 Mg Subl (Nitroglycerin) .Marland Kitchen.. 1 Tablet Under Tongue At Onset of Chest Pain; You May Repeat Every 5 Minutes For Up To 3 Doses. 9)  Isosorbide Mononitrate Cr 60 Mg Xr24h-Tab (Isosorbide Mononitrate) .... 2 By Mouth Once Daily 10)  Protonix 40 Mg Tbec (Pantoprazole Sodium) .Marland Kitchen.. 1 By Mouth Two Times A Day 11)  Advair Diskus 250-50 Mcg/dose Aepb (Fluticasone-Salmeterol) .... One Puff Twice Daily  Current Medications (verified): 1)  Aspirin 81 Mg Tbec (Aspirin) .... Take 1  Tablet By Mouth Once A Day 2)  Duoneb 2.5-0.5 Mg/66ml  Soln (Albuterol-Ipratropium) .... Inhale 1 Vial Via Hhn 4 Times Daily 3)  Klor-Con 10 10 Meq Cr-Tabs (Potassium Chloride) .Marland Kitchen.. 1 By Mouth With Food Once Daily As Needed When Taking Furosemide 4)  Furosemide 20 Mg Tabs (Furosemide) .Marland Kitchen.. 1 By Mouth Once Daily As Needed 5)  Ropinirole Hcl 1 Mg Tabs (Ropinirole Hcl) .Marland Kitchen.. 1 By Mouth At Bedtime 6)  Diazepam 10 Mg Tabs (Diazepam) .... 1/2 To 1 Tablet Every 8 Hours As Needed 7)  Simvastatin 40 Mg Tabs (Simvastatin) .Marland Kitchen.. 1 By Mouth At Bedtime 8)  Nitrostat 0.4 Mg Subl (Nitroglycerin) .Marland Kitchen.. 1 Tablet Under Tongue At Onset of Chest Pain; You May Repeat Every 5 Minutes For Up To 3 Doses. 9)  Isosorbide Mononitrate Cr 60 Mg Xr24h-Tab (Isosorbide Mononitrate) .... 2 By Mouth Once Daily 10)  Protonix 40 Mg Tbec (Pantoprazole Sodium) .Marland Kitchen.. 1 By Mouth Two Times A Day 11)  Advair Diskus 250-50 Mcg/dose Aepb (Fluticasone-Salmeterol) .... One Puff Twice Daily 12)  Diltiazem Hcl 120 Mg Tabs (Diltiazem Hcl) .... Take 1 Tablet By Mouth Once A Day  Allergies (verified): 1)  ! Azithromycin (Azithromycin) 2)  ! Hydrocodone  Past History:  Past Medical History: Last updated: 11/21/2008 1. Home oxygen since 2006. 2. Coronary artery disease status post CABG in 1996. 3. Catheterization in 2002 showing decreased LV function and 2-vessel   coronary artery disease. rpt cath 1/10 nml LVSF, patent grafts 4. Hypertension. 5. Diabetes. 6. Right-sided CVA requiring TPA in 2003. 7. Benign prostatic hypertrophy. 8. GERD.  Past Surgical History: Last updated: 11/21/2008 CABG '96] Cervical disk disease with arthrodesis in 2001 by Dr. Danielle Dess. Removal of sternal wires 2003.  Family History: Last updated: 01/19/09 Father:deceased chf 41 yo Mother:deceased 20 yo unknw Siblings:1 brother                2 sisters   Social History: Last updated: 07/03/2009 He has smoked as much as 5 packs per day, for the last few  years he was smoking about a pack per day and has quit since hospital admission 12/08. 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  Divorced  children 1   Risk Factors: Smoking Status: quit (03/14/2010)  Past Pulmonary History:  Pulmonary History: hospitalised 2008 for exacerbation Hospitalised 1/10 for unstable angina, rpt cath, radial graft patent. PFTs 1/09 >> FEV1 0.64 (21%), ratio 22, DLCO 35% 06/2009--CT chest New 7 mm nodule in the right upper lobe - recommend follow-up CT in 6 months.  Resolved previously noted right lower lobe nodule.  Review of Systems      See HPI  Vital Signs:  Patient profile:   65 year old male Height:      69 inches Weight:      142 pounds BMI:     21.05 O2 Sat:      98 % on  2 L/min cont Temp:     98.0 degrees F oral Pulse rate:   92 / minute BP sitting:   108 / 68  (left arm) Cuff size:   regular  Vitals Entered By: Boone Master CNA/MA (July 15, 2010 2:37 PM)  O2 Flow:  2 L/min cont CC: post hosp follow up - states breathing is doing well.  no new complaints. Is Patient Diabetic? No Comments Medications reviewed with patient Daytime contact number verified with patient. Boone Master CNA/MA  July 15, 2010 2:37 PM    Physical Exam  Additional Exam:  GEN: A/Ox3; pleasant , NAD HEENT:  [place]/AT, , EACs-clear, TMs-wnl, NOSE-clear, THROAT-clear NECK:  Supple w/ fair ROM; no JVD; normal carotid impulses w/o bruits; no thyromegaly or nodules palpated; no lymphadenopathy. RESP  Coarse BS w/ no wheezing , diminshed in bases  CARD:  RRR, no m/r/g   GI:   Soft & nt; nml bowel sounds; no organomegaly or masses detected. Musco: Warm bil,  no calf tenderness edema, clubbing, pulses intact Neuro: intact w/ no focal deficits noted.    Impression & Recommendations:  Problem # 1:  CHRONIC OBSTRUCTIVE PULMONARY DISEASE (ICD-496) Recent exacerbation w/ acute on chronic resp failure requiring vent support now resolved and appears near is  baseline.  Plan :   Problem # 2:  ATRIAL FIBRILLATION (ICD-427.31)  intertmittent afib during severe illness in hospital. remains in NSR since discharge.  intolrant to anticoagulants w/ hemoptysis during hospital stay follow up cards as planned and no coumadin at this point.  His updated medication list for this problem includes:    Aspirin 81 Mg Tbec (Aspirin) .Marland Kitchen... Take 1 tablet by mouth once a day  Orders: Est. Patient Level IV (82956)  Problem # 3:  PULMONARY NODULE, LEFT UPPER LOBE (ICD-518.89)  CT chest pending.  follow up Dr. Vassie Loll to discuss results.   Orders: Est. Patient Level IV (21308)  Medications Added to Medication List This Visit: 1)  Aspirin 81 Mg Tbec (Aspirin) .... Take 1 tablet by mouth once a day 2)  Diltiazem Hcl 120 Mg Tabs (Diltiazem hcl) .... Take 1 tablet by mouth once a day  Complete Medication List: 1)  Aspirin 81 Mg Tbec (Aspirin) .... Take 1 tablet by mouth once a day 2)  Duoneb 2.5-0.5 Mg/69ml Soln (Albuterol-ipratropium) .... Inhale 1 vial via hhn 4 times daily 3)  Klor-con 10 10 Meq Cr-tabs (Potassium chloride) .Marland Kitchen.. 1 by mouth with food once daily as needed when taking furosemide 4)  Furosemide 20 Mg Tabs (Furosemide) .Marland Kitchen.. 1 by mouth once daily as needed 5)  Ropinirole Hcl 1 Mg Tabs (Ropinirole hcl) .Marland Kitchen.. 1 by mouth at bedtime 6)  Diazepam 10 Mg Tabs (Diazepam) .... 1/2 to 1 tablet every 8 hours as needed 7)  Simvastatin 40 Mg Tabs (Simvastatin) .Marland Kitchen.. 1 by mouth at bedtime 8)  Nitrostat 0.4 Mg Subl (Nitroglycerin) .Marland Kitchen.. 1 tablet under tongue at onset of chest pain; you may repeat every 5 minutes for up to 3 doses. 9)  Isosorbide Mononitrate Cr 60 Mg Xr24h-tab (Isosorbide mononitrate) .... 2 by mouth once daily 10)  Protonix 40 Mg Tbec (Pantoprazole sodium) .Marland Kitchen.. 1 by mouth two times a day 11)  Advair Diskus 250-50 Mcg/dose Aepb (Fluticasone-salmeterol) .... One puff twice daily 12)  Diltiazem Hcl 120 Mg Tabs (Diltiazem hcl) .... Take 1 tablet by mouth  once a day  Patient Instructions: 1)  Finish Prednisone as directed.  2)  Continue with physical therapy.  3)  follow up for CT scan of lungs as planned  4)  follow up Dr. Vassie Loll after CT to review results.  5)  Please contact office for sooner follow up if symptoms do not improve or worsen  Prescriptions: ADVAIR DISKUS 250-50 MCG/DOSE AEPB (FLUTICASONE-SALMETEROL) one puff twice daily  #3 x 3   Entered and Authorized by:   Rubye Oaks NP   Signed by:   Tammy Parrett NP on 07/15/2010   Method used:   Faxed to ...       [facility] (mail-order)             , [place]         Ph:        Fax: [phone]   RxID:   [id number]

## 2010-09-10 NOTE — Letter (Signed)
Summary: [place] Results Engineer, agricultural at [facility]  [address], [place] [address]   Phone: [phone]  Fax: [phone]      August 23, 2009 MRN: [id number]   [facility] [address] [place], [place]  [address]   Dear [name],  Your test ordered by Selena Batten has been reviewed by your physician (or physician assistant) and was found to be normal or stable. Your physician (or physician assistant) felt no changes were needed at this time.  ____ Echocardiogram  ____ Cardiac Stress Test  ___x_ Lab Work  ____ Peripheral vascular study of arms, legs or neck  ____ CT scan or X-ray  ____ Lung or Breathing test  ____ Other:  No change in medical treatment at this time, per [name]  Thank you, Teressa Lower RN    Bel-Nor Bing, MD, F.A.C.Gaylord Shih, MD, F.A.C.C Lewayne Bunting, MD, F.A.C.C Nona Dell, MD, F.A.C.C Charlton Haws, MD, Lenise Arena.C.C

## 2010-09-10 NOTE — Progress Notes (Signed)
Summary: pt order  Phone Note Other Incoming Call back at [phone]   Caller: [email or web address] Summary of Call: Requests an order for home health pt 3x/wk for 4 weeks. Initial call taken by: Darletta Moll,  July 10, 2010 3:39 PM  Follow-up for Phone Call        LMOVMTCB Vernie Murders  July 10, 2010 4:12 PM   Verbal order needed for PT two times a week for 1 week then 3 times a week for 3 weeks and if any vital sign perimeters that need to be set? States that RA ordered PT at time of DC at hospital. Please advise.Reynaldo Minium CMA  July 10, 2010 4:17 PM   Additional Follow-up for Phone Call Additional follow up Details #1::        ok Additional Follow-up by: Comer Locket. Vassie Loll MD,  July 10, 2010 5:39 PM    Additional Follow-up for Phone Call Additional follow up Details #2::    Please advise on vital sign perimeters that need to be set. Thanks. Zackery Barefoot CMA  July 11, 2010 8:57 AM   per routine Follow-up by: Comer Locket. Vassie Loll MD,  July 11, 2010 1:01 PM  Additional Follow-up for Phone Call Additional follow up Details #3:: Details for Additional Follow-up Action Taken: [facility] Vernie Murders  July 11, 2010 1:46 PM  Called, spoke with Dana.  Verbal given for PT and VS routine along with ok to check o2 sats for SOB with activity prn.  Per Karn Pickler, they cannot do this without an order.   Additional Follow-up by: Gweneth Dimitri RN,  July 12, 2010 5:15 PM

## 2010-09-10 NOTE — Assessment & Plan Note (Signed)
Summary: F/U CT ///kp   Visit Type:  Follow-up Primary Provider/Referring Provider:  Patrica Duel  CC:  Pt here for CT scan follow up. Pt wants to discuss portable oxygen concentrator.  History of Present Illness: 65 /M, ex-smoker for FU of  severe COPD on home O2 since 2006. Pulmonary nodules have appeared & resolved on serial CTs since 11/08 CT of chest 11/08 did not show any evidence of pulmonary embolism but showed a spiculated nodule in the right upper lobe 1.2 x 1.6 cm.  CT chest 06/29/09 >> RLL nodule resolved, but new RUL 7 mm nodule. CTc hest 12/31/09 >> RUL nodule resolved, new 10 mm nodule LUL   Jan 03, 2010 11:17 AM  has been doing work and chores around the house,  his breathing has done well during these activities. not taking spiriva . Using neb  four times a day. Is very  active at home, does small jobs around house, rests frequently. reviewed CT chest, wants portable concentrator. Compliant with O2 x 24h Desaturates to 87% on second lap. Denies chest pain, dyspnea, orthopnea, hemoptysis, fever, n/v/d, edema, headache.   Current Medications (verified): 1)  Bayer Aspirin 325 Mg Tabs (Aspirin) .Marland Kitchen.. 1 By Mouth Once Daily 2)  Duoneb 2.5-0.5 Mg/4ml  Soln (Albuterol-Ipratropium) .... Inhale 1 Vial Via Hhn 4 Times Daily 3)  Klor-Con 10 10 Meq Cr-Tabs (Potassium Chloride) .Marland Kitchen.. 1 By Mouth With Food Once Daily As Needed When Taking Furosemide 4)  Furosemide 20 Mg Tabs (Furosemide) .Marland Kitchen.. 1 By Mouth Once Daily As Needed 5)  Ropinirole Hcl 1 Mg Tabs (Ropinirole Hcl) .Marland Kitchen.. 1 By Mouth At Bedtime 6)  Diazepam 10 Mg Tabs (Diazepam) .... 1/2 To 1 Tablet Every 8 Hours As Needed 7)  Simvastatin 40 Mg Tabs (Simvastatin) .Marland Kitchen.. 1 By Mouth At Bedtime 8)  Nitrostat 0.4 Mg Subl (Nitroglycerin) .Marland Kitchen.. 1 Tablet Under Tongue At Onset of Chest Pain; You May Repeat Every 5 Minutes For Up To 3 Doses. 9)  Isosorbide Mononitrate Cr 60 Mg Xr24h-Tab (Isosorbide Mononitrate) .... 2 By Mouth Once Daily 10)   Protonix 40 Mg Tbec (Pantoprazole Sodium) .Marland Kitchen.. 1 By Mouth Two Times A Day 11)  Advair Diskus 250-50 Mcg/dose Aepb (Fluticasone-Salmeterol) .Marland Kitchen.. 1 Puff Two Times A Day  Allergies (verified): 1)  ! Azithromycin (Azithromycin) 2)  ! Hydrocodone  Past History:  Past Medical History: Last updated: 11/21/2008 1. Home oxygen since 2006. 2. Coronary artery disease status post CABG in 1996. 3. Catheterization in 2002 showing decreased LV function and 2-vessel   coronary artery disease. rpt cath 1/10 nml LVSF, patent grafts 4. Hypertension. 5. Diabetes. 6. Right-sided CVA requiring TPA in 2003. 7. Benign prostatic hypertrophy. 8. GERD.  Social History: Last updated: 07/03/2009 He has smoked as much as 5 packs per day, for the last few years he was smoking about a pack per day and has quit since hospital admission 12/08. 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 PFTs 1/09 >> FEV1 0.64 (21%), ratio 22, DLCO 35% 06/2009--CT chest New 7 mm nodule in the right upper lobe - recommend follow-up CT in 6 months.  Resolved previously noted right lower lobe nodule.  Review of Systems       The patient complains of dyspnea on exertion.  The patient denies anorexia, fever, weight loss, weight gain, vision loss, decreased  hearing, hoarseness, chest pain, syncope, peripheral edema, prolonged cough, headaches, hemoptysis, abdominal pain, melena, hematochezia, severe indigestion/heartburn, hematuria, muscle weakness, suspicious skin lesions, difficulty walking, depression, unusual weight change, and abnormal bleeding.    Vital Signs:  Patient profile:   65 year old male Height:      69 inches Weight:      153 pounds BMI:     22.68 O2 Sat:      98 % on 2 L/min pulsed Temp:     97.8 degrees F oral Pulse rate:   74 /  minute BP sitting:   92 / 68  (left arm) Cuff size:   regular  Vitals Entered By: Zackery Barefoot CMA (Jan 03, 2010 10:54 AM)  O2 Flow:  2 L/min pulsed  Serial Vital Signs/Assessments:  Comments: Ambulatory Pulse Oximetry  Resting; HR__73___    02 Sat__91%RA___  Lap1 (185 feet)   HR__93___   02 Sat__92%RA___ Lap2 (185 feet)   HR_____   02 Sat_____    Lap3 (185 feet)   HR_____   02 Sat_____  ___Test Completed without Difficulty _X__Test Stopped due to:pt desat to 87% RA and then dropped immediately to 75%RA. Pt recovered to 94% 3liters at end of walk. Zackery Barefoot CMA  Jan 03, 2010 11:56 AM    By: Zackery Barefoot CMA   CC: Pt here for CT scan follow up. Pt wants to discuss portable oxygen concentrator Comments Medications reviewed with patient Verified contact number and pharmacy with patient Zackery Barefoot CMA  Jan 03, 2010 10:55 AM    Physical Exam  Additional Exam:  GEN: A/Ox3; pleasant , NAD HEENT:  [place]/AT, , EACs-clear, TMs-wnl, NOSE-clear, THROAT-clear NECK:  Supple w/ fair ROM; no JVD; normal carotid impulses w/o bruits; no thyromegaly or nodules palpated; no lymphadenopathy. RESP  Clear to P & A , decreased in bases  CARD:  RRR, no m/r/g   Musco: Warm bil,  no calf tenderness edema, clubbing, pulses intact    CT of Chest  Procedure date:  12/31/2009  Findings:      Comparison: CT 06/29/2009   Findings: Prior CT revealed a nodule in the posterior segment of the right upper lobe.  That nodule has resolved.  Stable small subpleural nodular like density in the right middle lobe likely associated with pleural scarring is stable.   There is a new somewhat spiculated nodular like density in the left upper lobe (image 12).  Measures approximately 9 x 10 mm.  Primary lung carcinoma cannot be excluded.   There is a vague hazy ground-glass like focus in the lateral aspect of the left upper lobe (images 18 - 21). This is probably an inflammatory focus.    Focal somewhat tree-in-bud densities in the anteromedial aspect of the left upper lobe (image 31) appears to represent a new finding. This may be due to focal bronchiolitis.   IMPRESSION: New spiculated density in the left upper lobe near the apex. Recommend follow-up chest CT in 3 months.  Impression & Recommendations:  Problem # 1:  PULMONARY NODULE, RIGHT UPPER LOBE (ICD-518.89) -resolved But needs 6 mnth FU of new nodule in LUL Orders: Prescription Created Electronically [id number]) Est. Patient Level IV (40102) Radiology Referral (Radiology)  Problem # 2:  CHRONIC OBSTRUCTIVE PULMONARY DISEASE (ICD-496) ct advair, did not toelrate spiriva ct O2 2 L/m pulse , ok to get portable concentrator.  Other Orders: DME Referral (DME)  Patient Instructions: 1)  Copy sent to: Dr Nobie Putnam 2)  Please schedule a follow-up  appointment in 3 months with TP. 3)  A Chest CT WITHOUT Contrast has been recommended in 6 months. Your imaging study may require preauthorization.    Immunization History:  Pneumovax Immunization History:    Pneumovax:  historical (05/12/2005)

## 2010-09-10 NOTE — Assessment & Plan Note (Signed)
Summary: 6 mth f/u per checkout on 01/23/09/tg  Medications Added NITROSTAT 0.4 MG SUBL (NITROGLYCERIN) 1 tablet under tongue at onset of chest pain; you may repeat every 5 minutes for up to 3 doses.      Allergies Added:   Visit Type:  Follow-up   Current Medications (verified): 1)  Bayer Aspirin 325 Mg Tabs (Aspirin) .Marland Kitchen.. 1 By Mouth Once Daily 2)  Duoneb 2.5-0.5 Mg/88ml  Soln (Albuterol-Ipratropium) .... Inhale 1 Vial Via Hhn 4 Times Daily 3)  Klor-Con 10 10 Meq Cr-Tabs (Potassium Chloride) .Marland Kitchen.. 1 By Mouth With Food Once Daily As Needed When Taking Furosemide 4)  Furosemide 20 Mg Tabs (Furosemide) .Marland Kitchen.. 1 By Mouth Once Daily As Needed 5)  Ropinirole Hcl 1 Mg Tabs (Ropinirole Hcl) .Marland Kitchen.. 1 By Mouth At Bedtime 6)  Diazepam 10 Mg Tabs (Diazepam) .... 1/2 To 1 Tablet Every 8 Hours As Needed 7)  Simvastatin 40 Mg Tabs (Simvastatin) .Marland Kitchen.. 1 By Mouth At Bedtime 8)  Nitrostat 0.4 Mg Subl (Nitroglycerin) .Marland Kitchen.. 1 Tablet Under Tongue At Onset of Chest Pain; You May Repeat Every 5 Minutes For Up To 3 Doses. 9)  Isosorbide Mononitrate Cr 60 Mg Xr24h-Tab (Isosorbide Mononitrate) .... 2 By Mouth Once Daily 10)  Protonix 40 Mg Tbec (Pantoprazole Sodium) .Marland Kitchen.. 1 By Mouth Two Times A Day 11)  Advair Diskus 250-50 Mcg/dose Aepb (Fluticasone-Salmeterol) .Marland Kitchen.. 1 Puff Two Times A Day  Allergies (verified): 1)  ! Azithromycin (Azithromycin) 2)  ! Hydrocodone  Vital Signs:  Patient profile:   65 year old male Height:      64 inches Weight:      148 pounds O2 Sat:      93 % on 2 L/min Pulse rate:   86 / minute BP sitting:   118 / 71  (left arm)  Vitals Entered By: [name], CNA (August 20, 2009 1:08 PM)  O2 Flow:  2 L/min   Other Orders: T-Basic Metabolic Panel [phone]) T-Lipid Profile [id number] T-Hepatic Function [phone])  Patient Instructions: 1)  Your physician recommends that you return for lab work in: This week 2)  Your physician recommends that you continue on your  current medications as directed. Please refer to the Current Medication list given to you today. 3)  Your physician recommends that you schedule a follow-up appointment in: 6 months

## 2010-09-10 NOTE — Letter (Signed)
Summary:  Results Engineer, agricultural at [facility]  [address] [place] [address]   Phone: [phone]  Fax: [phone]      August 24, 2009 MRN: [id number]   [facility] [place] [address] [place], [place]  [address]   Dear Mr. SCHWABE,  Your test ordered by Selena Batten has been reviewed by your physician (or physician assistant) and was found to be normal or stable. Your physician (or physician assistant) felt no changes were needed at this time.  ____ Echocardiogram  ____ Cardiac Stress Test  __X__ Lab Work  ____ Peripheral vascular study of arms, legs or neck  ____ CT scan or X-ray  ____ Lung or Breathing test  ____ Other: Please continue on current medical treatment. Copy of your labs have also been sent to Dr. Nobie Putnam.   Thank you.   Joni Reining, NP

## 2010-09-10 NOTE — Progress Notes (Signed)
Summary: fyi  Phone Note Call from Patient   Caller: Patient Call For: ALVA Summary of Call: pt stopped taking symbicort and started back on advair. Initial call taken by: Rickard Patience,  March 25, 2010 12:00 PM  Follow-up for Phone Call         FYI: Pt states  he had increased SOb and insomnia while taking the symbicort, so he stopped and went back on advair. He states he is sleeping normally and has no SOB since going back on Advair. Carron Curie CMA  March 25, 2010 12:14 PM   Additional Follow-up for Phone Call Additional follow up Details #1::        that is fine  update med list please Additional Follow-up by: Rubye Oaks NP,  March 25, 2010 12:17 PM    Additional Follow-up for Phone Call Additional follow up Details #2::    ok med list updated.Carron Curie CMA  March 25, 2010 12:20 PM   New/Updated Medications: ADVAIR DISKUS 250-50 MCG/DOSE AEPB (FLUTICASONE-SALMETEROL) one puff twice daily

## 2010-09-10 NOTE — Assessment & Plan Note (Signed)
Summary: 6 MTH F/U PER CHECKOUT ON 08/20/09/TG      Allergies Added:   Visit Type:  Follow-up Primary Provider:  [facility]  CC:  no cardiology complaints.  History of Present Illness: [name] returns today for management of his coronary disease, history of bypass surgery, and hyperlipidemia.  He is having no angina or ischemic symptoms. He does have his dyspnea with his end-stage O2 dependent COPD. He is however in pulmonary rehabilitation and seems to be getting stronger. He's lost 5 pounds.  He has not had to use nitroglycerin.  His last blood work showed excellent results with a cholesterol 139, triglycerides of 46, ratio 57, VLDL of 9, LDL 73, normal electrolytes and normal LFTs.  Current Medications (verified): 1)  Bayer Aspirin 325 Mg Tabs (Aspirin) .Marland Kitchen.. 1 By Mouth Once Daily 2)  Duoneb 2.5-0.5 Mg/64ml  Soln (Albuterol-Ipratropium) .... Inhale 1 Vial Via Hhn 4 Times Daily 3)  Klor-Con 10 10 Meq Cr-Tabs (Potassium Chloride) .Marland Kitchen.. 1 By Mouth With Food Once Daily As Needed When Taking Furosemide 4)  Furosemide 20 Mg Tabs (Furosemide) .Marland Kitchen.. 1 By Mouth Once Daily As Needed 5)  Ropinirole Hcl 1 Mg Tabs (Ropinirole Hcl) .Marland Kitchen.. 1 By Mouth At Bedtime 6)  Diazepam 10 Mg Tabs (Diazepam) .... 1/2 To 1 Tablet Every 8 Hours As Needed 7)  Simvastatin 40 Mg Tabs (Simvastatin) .Marland Kitchen.. 1 By Mouth At Bedtime 8)  Nitrostat 0.4 Mg Subl (Nitroglycerin) .Marland Kitchen.. 1 Tablet Under Tongue At Onset of Chest Pain; You May Repeat Every 5 Minutes For Up To 3 Doses. 9)  Isosorbide Mononitrate Cr 60 Mg Xr24h-Tab (Isosorbide Mononitrate) .... 2 By Mouth Once Daily 10)  Protonix 40 Mg Tbec (Pantoprazole Sodium) .Marland Kitchen.. 1 By Mouth Two Times A Day 11)  Advair Diskus 250-50 Mcg/dose Aepb (Fluticasone-Salmeterol) .... One Puff Twice Daily  Allergies (verified): 1)  ! Azithromycin (Azithromycin) 2)  ! Hydrocodone  Past History:  Past Medical History: Last updated: 11/21/2008 1. Home oxygen since 2006. 2. Coronary  artery disease status post CABG in 1996. 3. Catheterization in 2002 showing decreased LV function and 2-vessel   coronary artery disease. rpt cath 1/10 nml LVSF, patent grafts 4. Hypertension. 5. Diabetes. 6. Right-sided CVA requiring TPA in 2003. 7. Benign prostatic hypertrophy. 8. GERD.  Past Surgical History: Last updated: 11/21/2008 CABG '96] Cervical disk disease with arthrodesis in 2001 by Dr. Danielle Dess. Removal of sternal wires 2003.  Family History: Last updated: 2009-02-02 Father:deceased chf 100 yo Mother:deceased 105 yo unknw Siblings:1 brother                2 sisters   Social History: Last updated: 07/03/2009 He has smoked as much as 5 packs per day, for the last few years he was smoking about a pack per day and has quit since hospital admission 12/08. 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  Divorced  children 1   Risk Factors: Smoking Status: quit (03/14/2010)  Review of Systems       negative other than history of present illness  Vital Signs:  Patient profile:   65 year old male Weight:      150 pounds Pulse rate:   89 / minute BP sitting:   122 / 77  (right arm)  Vitals Entered By: [name], CNA (March 25, 2010 2:57 PM)  Physical Exam  General:  chronically ill, very pleasant, wearing O2 Head:  normocephalic and atraumatic Eyes:  PERRLA/EOM intact; conjunctiva and lids normal. Neck:  Neck supple, no JVD. No masses, thyromegaly or abnormal cervical nodes. Chest Wall:  no deformities or breast masses noted Lungs:  decreased breath sounds throughout Heart:  soft S1-S2, no murmur rub or gallop, carotid strokes equal bilateral without bruit Msk:  decreased ROM.   Pulses:  pulses normal in all 4 extremities Extremities:  No clubbing or cyanosis. Neurologic:  Alert and oriented x 3. Skin:  Intact without lesions or rashes. Psych:  Normal affect.   Impression & Recommendations:  Problem # 1:  CORONARY ATHEROSCLEROSIS OF ARTERY  BYPASS GRAFT (ICD-414.04)  His updated medication list for this problem includes:    Bayer Aspirin 325 Mg Tabs (Aspirin) .Marland Kitchen... 1 by mouth once daily    Nitrostat 0.4 Mg Subl (Nitroglycerin) .Marland Kitchen... 1 tablet under tongue at onset of chest pain; you may repeat every 5 minutes for up to 3 doses.    Isosorbide Mononitrate Cr 60 Mg Xr24h-tab (Isosorbide mononitrate) .Marland Kitchen... 2 by mouth once daily  Problem # 2:  HYPERTENSION (ICD-401.9)  His updated medication list for this problem includes:    Bayer Aspirin 325 Mg Tabs (Aspirin) .Marland Kitchen... 1 by mouth once daily    Furosemide 20 Mg Tabs (Furosemide) .Marland Kitchen... 1 by mouth once daily as needed  Problem # 3:  HYPERLIPIDEMIA (ICD-272.4) Assessment: Improved  His updated medication list for this problem includes:    Simvastatin 40 Mg Tabs (Simvastatin) .Marland Kitchen... 1 by mouth at bedtime  Patient Instructions: 1)  Your physician recommends that you schedule a follow-up appointment in: January 2012 2)  Your physician recommends that you continue on your current medications as directed. Please refer to the Current Medication list given to you today.

## 2010-09-10 NOTE — Progress Notes (Signed)
Summary: CT NEXT WEEK  Phone Note Call from Patient Call back at [facility] Phone [phone]   Caller: Patient Call For: Chandlar Guice Summary of Call: PT HAS A CT SCHEDULED FOR 06/28/10. HE DOES NOT WANT THE RESULTS READ BY [facility] AND THEN HAVE IT READ SOMEWHERE ELSE. HE SAYS LAST TIME HE HAD A CT HE WAS CHARGED FOR HAVING IT READ TWICE AND HE CANNOT AFFORD THIS.  Initial call taken by: Tivis Ringer, CNA,  June 21, 2010 11:25 AM  Follow-up for Phone Call        Spoke with pt and advised he needed to contact where he had the CT done and speak to their billing department to ask about his last bill and to see what the difference was that made him get 2 bills. I advised it was not from Dr. Vassie Loll. Pt states understanding. Carron Curie CMA  June 21, 2010 11:55 AM

## 2010-09-10 NOTE — Progress Notes (Signed)
Summary: [facility] nurse-   Phone Note From Other Clinic   Caller: [name]- nurse w/ [facility] Call For: [name] Summary of Call: caller says she faxed request for home health eval on 11/30. she got PT set up but also waiting ti hear what dr Reginia Naas recs re: cardio/ pulmn rehab were and skilled nursing since pt has a trach/ afib, etc. fax # [phone]. phone # [phone] Initial call taken by: Tivis Ringer, CNA,  July 17, 2010 3:32 PM  Follow-up for Phone Call        [facility].  Spoke with [name].  Was told [name] has left for the day but she will have [name] return call tomorrow.   David Dimitri RN  July 17, 2010 5:06 PM  [name] with [name] called wants to verify if RA feels the pt will benefit from having a nurse. please advise. thanks. [name] CMA  July 18, 2010 10:52 AM   Additional Follow-up for Phone Call Additional follow up Details #1::        No need for nurse Will set up pulm rehab on FU visit with [place] Additional Follow-up by: [name]. David Loll MD,  July 18, 2010 2:51 PM    Additional Follow-up for Phone Call Additional follow up Details #2::    [name] at [facility] has been advised. [name] CMA  July 18, 2010 3:03 PM

## 2010-09-10 NOTE — Assessment & Plan Note (Signed)
Summary: NP follow up - COPD   Primary Provider/Referring Provider:  Patrica Duel  CC:  follow up - states is doing well since last OV.  no new complaints.  pt began pulm rehab 1 month ago.  History of Present Illness: 65 /M, ex-smoker for FU of  severe COPD on home O2 since 2006. Pulmonary nodules have appeared & resolved on serial CTs since 11/08 CT of chest 11/08 did not show any evidence of pulmonary embolism but showed a spiculated nodule in the right upper lobe 1.2 x 1.6 cm.  CT chest 06/29/09 >> RLL nodule resolved, but new RUL 7 mm nodule. CTc hest 12/31/09 >> RUL nodule resolved, new 10 mm nodule LUL   Jan 03, 2010 11:17 AM  has been doing work and chores around the house,  his breathing has done well during these activities. not taking spiriva . Using neb  four times a day. Is very  active at home, does small jobs around house, rests frequently. reviewed CT chest, wants portable concentrator. Compliant with O2 x 24h Desaturates to 87% on second lap.   March 14, 2010--Presents for follow up - states is doing well since last OV.  no new complaints.  pt began pulm rehab 1 month ago. He feels he is better w/ rehab, using treadmill and bike. He is upset today b/c he got to seperate bills from last CT scan. He wants to try Symbicort instead of Advair. Saw advertisement on TV and thinks advair causes him a sore throat. We discuused several options w/ calling billing at [facility] and calling number on bill.  Denies chest pain, orthopnea, hemoptysis, fever, n/v/d, edema, headache.   Preventive Screening-Counseling & Management  Alcohol-Tobacco     Smoking Status: quit     Year Quit: 06-2007  Medications Prior to Update: 1)  Bayer Aspirin 325 Mg Tabs (Aspirin) .Marland Kitchen.. 1 By Mouth Once Daily 2)  Duoneb 2.5-0.5 Mg/33ml  Soln (Albuterol-Ipratropium) .... Inhale 1 Vial Via Hhn 4 Times Daily 3)  Klor-Con 10 10 Meq Cr-Tabs (Potassium Chloride) .Marland Kitchen.. 1 By Mouth With Food Once Daily As Needed When  Taking Furosemide 4)  Furosemide 20 Mg Tabs (Furosemide) .Marland Kitchen.. 1 By Mouth Once Daily As Needed 5)  Ropinirole Hcl 1 Mg Tabs (Ropinirole Hcl) .Marland Kitchen.. 1 By Mouth At Bedtime 6)  Diazepam 10 Mg Tabs (Diazepam) .... 1/2 To 1 Tablet Every 8 Hours As Needed 7)  Simvastatin 40 Mg Tabs (Simvastatin) .Marland Kitchen.. 1 By Mouth At Bedtime 8)  Nitrostat 0.4 Mg Subl (Nitroglycerin) .Marland Kitchen.. 1 Tablet Under Tongue At Onset of Chest Pain; You May Repeat Every 5 Minutes For Up To 3 Doses. 9)  Isosorbide Mononitrate Cr 60 Mg Xr24h-Tab (Isosorbide Mononitrate) .... 2 By Mouth Once Daily 10)  Protonix 40 Mg Tbec (Pantoprazole Sodium) .Marland Kitchen.. 1 By Mouth Two Times A Day 11)  Advair Diskus 250-50 Mcg/dose Aepb (Fluticasone-Salmeterol) .Marland Kitchen.. 1 Puff Two Times A Day  Current Medications (verified): 1)  Bayer Aspirin 325 Mg Tabs (Aspirin) .Marland Kitchen.. 1 By Mouth Once Daily 2)  Duoneb 2.5-0.5 Mg/70ml  Soln (Albuterol-Ipratropium) .... Inhale 1 Vial Via Hhn 4 Times Daily 3)  Klor-Con 10 10 Meq Cr-Tabs (Potassium Chloride) .Marland Kitchen.. 1 By Mouth With Food Once Daily As Needed When Taking Furosemide 4)  Furosemide 20 Mg Tabs (Furosemide) .Marland Kitchen.. 1 By Mouth Once Daily As Needed 5)  Ropinirole Hcl 1 Mg Tabs (Ropinirole Hcl) .Marland Kitchen.. 1 By Mouth At Bedtime 6)  Diazepam 10 Mg Tabs (Diazepam) .... 1/2 To 1  Tablet Every 8 Hours As Needed 7)  Simvastatin 40 Mg Tabs (Simvastatin) .Marland Kitchen.. 1 By Mouth At Bedtime 8)  Nitrostat 0.4 Mg Subl (Nitroglycerin) .Marland Kitchen.. 1 Tablet Under Tongue At Onset of Chest Pain; You May Repeat Every 5 Minutes For Up To 3 Doses. 9)  Isosorbide Mononitrate Cr 60 Mg Xr24h-Tab (Isosorbide Mononitrate) .... 2 By Mouth Once Daily 10)  Protonix 40 Mg Tbec (Pantoprazole Sodium) .Marland Kitchen.. 1 By Mouth Two Times A Day 11)  Advair Diskus 250-50 Mcg/dose Aepb (Fluticasone-Salmeterol) .Marland Kitchen.. 1 Puff Two Times A Day 12)  Mucinex D 60-600 Mg Xr12h-Tab (Pseudoephedrine-Guaifenesin) .... Take 1-2 Tablets Every 12 Hours As Needed  Allergies (verified): 1)  ! Azithromycin  (Azithromycin) 2)  ! Hydrocodone  Past History:  Past Medical History: Last updated: 11/21/2008 1. Home oxygen since 2006. 2. Coronary artery disease status post CABG in 1996. 3. Catheterization in 2002 showing decreased LV function and 2-vessel   coronary artery disease. rpt cath 1/10 nml LVSF, patent grafts 4. Hypertension. 5. Diabetes. 6. Right-sided CVA requiring TPA in 2003. 7. Benign prostatic hypertrophy. 8. GERD.  Past Surgical History: Last updated: 11/21/2008 CABG '96] Cervical disk disease with arthrodesis in 2001 by Dr. Danielle Dess. Removal of sternal wires 2003.  Social History: Last updated: 07/03/2009 He has smoked as much as 5 packs per day, for the last few years he was smoking about a pack per day and has quit since hospital admission 12/08. 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 PFTs 1/09 >> FEV1 0.64 (21%), ratio 22, DLCO 35% 06/2009--CT chest New 7 mm nodule in the right upper lobe - recommend follow-up CT in 6 months.  Resolved previously noted right lower lobe nodule.  Vital Signs:  Patient profile:   65 year old male Height:      69 inches Weight:      153.38 pounds BMI:     22.73 O2 Sat:      93 % on 2 L/min pulsing Temp:     96.9 degrees F oral Pulse rate:   97 / minute BP sitting:   130 / 78  (left arm) Cuff size:   regular  Vitals Entered By: Boone Master CNA/MA (March 14, 2010 10:58 AM)  O2 Flow:  2 L/min pulsing CC: follow up - states is doing well since last OV.  no new complaints.  pt began pulm rehab 1 month ago Is Patient Diabetic? No Comments Medications reviewed with patient Daytime contact number verified with patient. Boone Master CNA/MA  March 14, 2010 10:58 AM    Physical Exam  Additional Exam:  GEN: A/Ox3; pleasant , NAD HEENT:   [place]/AT, , EACs-clear, TMs-wnl, NOSE-clear, THROAT-clear NECK:  Supple w/ fair ROM; no JVD; normal carotid impulses w/o bruits; no thyromegaly or nodules palpated; no lymphadenopathy. RESP  Clear to P & A , decreased in bases  CARD:  RRR, no m/r/g   Musco: Warm bil,  no calf tenderness edema, clubbing, pulses intact    Impression & Recommendations:  Problem # 1:  CHRONIC OBSTRUCTIVE PULMONARY DISEASE (ICD-496) Stop Advair (per pt request-w/ c/o of sore throat) Begin Symbicort 160/4.12mcg 2 puffs two times a day , brush/rinse/gargle after use.  Follow up Dr. Vassie Loll in 2-3 months  cont on same meds.  cont w/ pulm rehab follow up Dr. Vassie Loll in  4 months   Problem # 2:  PULMONARY NODULE, LEFT UPPER LOBE (ICD-518.89)  CTc hest 12/31/09 >> RUL nodule resolved, new 10 mm nodule LUL will have CT set up for serial follow up at 6 mon.   Orders: Est. Patient Level III (16109)  Medications Added to Medication List This Visit: 1)  Symbicort 160-4.5 Mcg/act Aero (Budesonide-formoterol fumarate) .... 2 puffs two times a day 2)  Mucinex D 60-600 Mg Xr12h-tab (Pseudoephedrine-guaifenesin) .... Take 1-2 tablets every 12 hours as needed  Complete Medication List: 1)  Bayer Aspirin 325 Mg Tabs (Aspirin) .Marland Kitchen.. 1 by mouth once daily 2)  Duoneb 2.5-0.5 Mg/25ml Soln (Albuterol-ipratropium) .... Inhale 1 vial via hhn 4 times daily 3)  Klor-con 10 10 Meq Cr-tabs (Potassium chloride) .Marland Kitchen.. 1 by mouth with food once daily as needed when taking furosemide 4)  Furosemide 20 Mg Tabs (Furosemide) .Marland Kitchen.. 1 by mouth once daily as needed 5)  Ropinirole Hcl 1 Mg Tabs (Ropinirole hcl) .Marland Kitchen.. 1 by mouth at bedtime 6)  Diazepam 10 Mg Tabs (Diazepam) .... 1/2 to 1 tablet every 8 hours as needed 7)  Simvastatin 40 Mg Tabs (Simvastatin) .Marland Kitchen.. 1 by mouth at bedtime 8)  Nitrostat 0.4 Mg Subl (Nitroglycerin) .Marland Kitchen.. 1 tablet under tongue at onset of chest pain; you may repeat every 5 minutes for up to 3 doses. 9)  Isosorbide Mononitrate Cr  60 Mg Xr24h-tab (Isosorbide mononitrate) .... 2 by mouth once daily 10)  Protonix 40 Mg Tbec (Pantoprazole sodium) .Marland Kitchen.. 1 by mouth two times a day 11)  Symbicort 160-4.5 Mcg/act Aero (Budesonide-formoterol fumarate) .... 2 puffs two times a day 12)  Mucinex D 60-600 Mg Xr12h-tab (Pseudoephedrine-guaifenesin) .... Take 1-2 tablets every 12 hours as needed  Patient Instructions: 1)  Stop Advair 2)  Begin Symbicort 160/4.38mcg 2 puffs two times a day , brush/rinse/gargle after use.  3)  Follow up Dr. Vassie Loll in 2-3 months  Prescriptions: SYMBICORT 160-4.5 MCG/ACT AERO (BUDESONIDE-FORMOTEROL FUMARATE) 2 puffs two times a day  #1 x 5   Entered and Authorized by:   Rubye Oaks NP   Signed by:   Tammy Parrett NP on 03/14/2010   Method used:   Print then Give to Patient   RxID:   [id number]     Appended Document: add serial CT for nov 2011 for follow up of LUL nodule

## 2010-09-12 NOTE — Miscellaneous (Signed)
Summary: Medication Issue Communication Order / Genevieve Norlander  Medication Issue Communication Order / [facility]   Imported ByLennie Odor 07/26/2010 15:50:42  _____________________________________________________________________  External Attachment:    Type:   Image     Comment:   External Document

## 2010-09-12 NOTE — Assessment & Plan Note (Signed)
Summary: 1 WEEK F/U LUL NODULE AFTER CT CHEST/OK[facility]D PER J[facility]   Visit Type:  Follow-up Primary Provider/Referring Provider:  [facility]  CC:  Follow up .  History of Present Illness: 51 /M, ex-smoker for FU of  severe COPD on home O2 since 2006. Pulmonary nodules have appeared & resolved on serial CTs since 11/08 CT of chest 11/08 did not show any evidence of pulmonary embolism but showed a spiculated nodule in the right upper lobe 1.2 x 1.6 cm.  CT chest 06/29/09 >> RLL nodule resolved, but new RUL 7 mm nodule. CTc hest 12/31/09 >> RUL nodule resolved, new 10 mm nodule LUL   Jan 03, 2010 11:17 AM  Desaturates to 87% on second lap.   August 01, 2010 1:43 PM  Admitted 11/17-11/28/11 for Acute hypercarbic on chronic respiratory failure requiring MV.   Prednisone tapered over 2 weeks.  Seen by Cardiology was consulted in the setting of atrial fibrillation  -had hemoptysis in the setting of Coumadin. At the time of discharge, he was in sinus rhythm and will not be continued on anticoagulants.  On nectar thick liquids after dysphagia evaln, tremors better than 'what used to be ', off prednisone CT chest 12/11 - RUL density decreased, LUL density- stable, RLL band like density  new - favor inflammatory. He received letter from [facility] about tremors with ? cardizem  Denies chest pain, orthopnea, hemoptysis, fever, n/v/d, edema, headache.         Preventive Screening-Counseling & Management  Alcohol-Tobacco     Smoking Status: quit     Year Quit: 06-2007  Allergies (verified): 1)  ! Azithromycin (Azithromycin) 2)  ! Hydrocodone  Past History:  Past Medical History: Last updated: 11/21/2008 1. Home oxygen since 2006. 2. Coronary artery disease status post CABG in 1996. 3. Catheterization in 2002 showing decreased LV function and 2-vessel   coronary artery disease. rpt cath 1/10 nml LVSF, patent grafts 4. Hypertension. 5. Diabetes. 6. Right-sided CVA requiring TPA in  2003. 7. Benign prostatic hypertrophy. 8. GERD.  Social History: Last updated: 07/03/2009 He has smoked as much as 5 packs per day, for the last few years he was smoking about a pack per day and has quit since hospital admission 12/08. 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 PFTs 1/09 >> FEV1 0.64 (21%), ratio 22, DLCO 35% Hosp 11/17-11/28/11 for Acute hypercarbic on chronic respiratory failure requiring MV.     Review of Systems       The patient complains of dyspnea on exertion.  The patient denies anorexia, fever, weight loss, weight gain, vision loss, decreased hearing, hoarseness, chest pain, syncope, peripheral edema, prolonged cough, headaches, hemoptysis, abdominal pain, melena, hematochezia, severe indigestion/heartburn, hematuria, muscle weakness, suspicious skin lesions, difficulty walking, depression, unusual weight change, abnormal bleeding, enlarged lymph nodes, and angioedema.    Vital Signs:  Patient profile:   65 year old male Height:      69 inches Weight:      144.8 pounds BMI:     21.46 O2 Sat:      91 % on 2 L/min pulsed Temp:     97.5 degrees F oral Pulse rate:   81 / minute BP sitting:   104 / 60  (left arm) Cuff size:   regular  Vitals Entered By: Zackery Barefoot CMA (August 01, 2010 1:37 PM)  O2 Flow:  2 L/min pulsed CC: Follow up  Comments Medications reviewed with patient Verified contact number and pharmacy with patient Zackery Barefoot CMA  August 01, 2010 1:37 PM    Physical Exam  Additional Exam:  GEN: A/Ox3; pleasant , NAD HEENT:  [place]/AT, , EACs-clear, TMs-wnl, NOSE-clear, THROAT-clear NECK:  Supple w/ fair ROM; no JVD; normal carotid impulses w/o bruits; no thyromegaly or nodules palpated; no lymphadenopathy. RESP  Coarse BS w/ no wheezing , diminshed  in bases  CARD:  RRR, no m/r/g   Musco: Warm bil,  no calf tenderness edema, clubbing, pulses intact    Impression & Recommendations:  Problem # 1:  CHRONIC OBSTRUCTIVE PULMONARY DISEASE (ICD-496) Close to baseline after recent illness. ct advair, decrease duonebs to three times a day ct O2 ? [facility]iva in future  Problem # 2:  PULMONARY NODULE, LEFT UPPER LOBE (ICD-518.89)  Needs 6 mnth FU but he is worried about bills & would prefer 12 month FU ct This should be ok  Orders: Est. Patient Level IV (16109)  Problem # 3:  ATRIAL FIBRILLATION (ICD-427.31)  No anticoagulation since no recurrence He will send [place] the letter he has received from [facility]. His updated medication list for this problem includes:    Aspirin 81 Mg Tbec (Aspirin) .Marland Kitchen... Take 1 tablet by mouth once a day  Orders: Est. Patient Level IV (60454)  Patient Instructions: 1)  Copy sent to: [facility] 2)  Please schedule a follow-up appointment in 3 months with TP 3)  Decrease duonebs to three times a day  4)  Stay on advair 5)  Send me letter from  [facility] about diltiazem 120 mg 6)  CT scan in 78month- 1 year   Immunization History:  Influenza Immunization History:    Influenza:  historical (04/19/2010)  Pneumovax Immunization History:    Pneumovax:  historical (04/22/2010)

## 2010-09-12 NOTE — Miscellaneous (Signed)
Summary: [facility] Plans/[facility]  [facility]   Imported By: Sherian Rein 08/14/2010 12:44:06  _____________________________________________________________________  External Attachment:    Type:   Image     Comment:   External Document

## 2010-10-18 ENCOUNTER — Encounter: Payer: Self-pay | Admitting: Cardiology

## 2010-10-18 ENCOUNTER — Ambulatory Visit (INDEPENDENT_AMBULATORY_CARE_PROVIDER_SITE_OTHER): Payer: BC Managed Care – PPO | Admitting: Cardiology

## 2010-10-18 DIAGNOSIS — I2510 Atherosclerotic heart disease of native coronary artery without angina pectoris: Secondary | ICD-10-CM

## 2010-10-18 DIAGNOSIS — I4891 Unspecified atrial fibrillation: Secondary | ICD-10-CM

## 2010-10-22 LAB — BASIC METABOLIC PANEL
BUN: 19 mg/dL (ref 6–23)
BUN: 22 mg/dL (ref 6–23)
BUN: 23 mg/dL (ref 6–23)
BUN: 25 mg/dL — ABNORMAL HIGH (ref 6–23)
CO2: 29 mEq/L (ref 19–32)
CO2: 35 mEq/L — ABNORMAL HIGH (ref 19–32)
CO2: 37 mEq/L — ABNORMAL HIGH (ref 19–32)
CO2: 37 mEq/L — ABNORMAL HIGH (ref 19–32)
Calcium: 8.3 mg/dL — ABNORMAL LOW (ref 8.4–10.5)
Calcium: 8.7 mg/dL (ref 8.4–10.5)
Calcium: 8.8 mg/dL (ref 8.4–10.5)
Chloride: 102 mEq/L (ref 96–112)
Chloride: 93 mEq/L — ABNORMAL LOW (ref 96–112)
Chloride: 99 mEq/L (ref 96–112)
Chloride: 99 mEq/L (ref 96–112)
Creatinine, Ser: 0.91 mg/dL (ref 0.4–1.5)
Creatinine, Ser: 0.96 mg/dL (ref 0.4–1.5)
Creatinine, Ser: 0.99 mg/dL (ref 0.4–1.5)
GFR calc Af Amer: >60 mL/min (ref >60)
GFR calc Af Amer: >60 mL/min (ref >60)
GFR calc Af Amer: >60 mL/min (ref >60)
GFR calc Af Amer: >60 mL/min (ref >60)
GFR calc Af Amer: >60 mL/min (ref >60)
GFR calc non Af Amer: >60 mL/min (ref >60)
GFR calc non Af Amer: >60 mL/min (ref >60)
GFR calc non Af Amer: >60 mL/min (ref >60)
GFR calc non Af Amer: >60 mL/min (ref >60)
GFR calc non Af Amer: >60 mL/min (ref >60)
Glucose, Bld: 119 mg/dL — ABNORMAL HIGH (ref 70–99)
Glucose, Bld: 121 mg/dL — ABNORMAL HIGH (ref 70–99)
Glucose, Bld: 130 mg/dL — ABNORMAL HIGH (ref 70–99)
Glucose, Bld: 137 mg/dL — ABNORMAL HIGH (ref 70–99)
Glucose, Bld: 171 mg/dL — ABNORMAL HIGH (ref 70–99)
Glucose, Bld: 93 mg/dL (ref 70–99)
Potassium: 3.9 mEq/L (ref 3.5–5.1)
Potassium: 4.2 mEq/L (ref 3.5–5.1)
Potassium: 4.5 mEq/L (ref 3.5–5.1)
Potassium: 4.5 mEq/L (ref 3.5–5.1)
Sodium: 131 mEq/L — ABNORMAL LOW (ref 135–145)
Sodium: 139 mEq/L (ref 135–145)
Sodium: 140 mEq/L (ref 135–145)
Sodium: 141 mEq/L (ref 135–145)
Sodium: 144 mEq/L (ref 135–145)

## 2010-10-22 LAB — COMPREHENSIVE METABOLIC PANEL
ALT: 24 U/L (ref 0–53)
AST: 19 U/L (ref 0–37)
CO2: 38 mEq/L — ABNORMAL HIGH (ref 19–32)
Calcium: 8.4 mg/dL (ref 8.4–10.5)
GFR calc Af Amer: >60 mL/min (ref >60)
GFR calc non Af Amer: >60 mL/min (ref >60)
Potassium: 4 mEq/L (ref 3.5–5.1)
Sodium: 143 mEq/L (ref 135–145)

## 2010-10-22 LAB — GLUCOSE, CAPILLARY
Glucose-Capillary: 107 mg/dL — ABNORMAL HIGH (ref 70–99)
Glucose-Capillary: 110 mg/dL — ABNORMAL HIGH (ref 70–99)
Glucose-Capillary: 113 mg/dL — ABNORMAL HIGH (ref 70–99)
Glucose-Capillary: 118 mg/dL — ABNORMAL HIGH (ref 70–99)
Glucose-Capillary: 119 mg/dL — ABNORMAL HIGH (ref 70–99)
Glucose-Capillary: 120 mg/dL — ABNORMAL HIGH (ref 70–99)
Glucose-Capillary: 123 mg/dL — ABNORMAL HIGH (ref 70–99)
Glucose-Capillary: 130 mg/dL — ABNORMAL HIGH (ref 70–99)
Glucose-Capillary: 130 mg/dL — ABNORMAL HIGH (ref 70–99)
Glucose-Capillary: 134 mg/dL — ABNORMAL HIGH (ref 70–99)
Glucose-Capillary: 138 mg/dL — ABNORMAL HIGH (ref 70–99)
Glucose-Capillary: 139 mg/dL — ABNORMAL HIGH (ref 70–99)
Glucose-Capillary: 139 mg/dL — ABNORMAL HIGH (ref 70–99)
Glucose-Capillary: 143 mg/dL — ABNORMAL HIGH (ref 70–99)
Glucose-Capillary: 143 mg/dL — ABNORMAL HIGH (ref 70–99)
Glucose-Capillary: 144 mg/dL — ABNORMAL HIGH (ref 70–99)
Glucose-Capillary: 146 mg/dL — ABNORMAL HIGH (ref 70–99)
Glucose-Capillary: 151 mg/dL — ABNORMAL HIGH (ref 70–99)
Glucose-Capillary: 163 mg/dL — ABNORMAL HIGH (ref 70–99)
Glucose-Capillary: 165 mg/dL — ABNORMAL HIGH (ref 70–99)
Glucose-Capillary: 169 mg/dL — ABNORMAL HIGH (ref 70–99)
Glucose-Capillary: 169 mg/dL — ABNORMAL HIGH (ref 70–99)
Glucose-Capillary: 171 mg/dL — ABNORMAL HIGH (ref 70–99)
Glucose-Capillary: 175 mg/dL — ABNORMAL HIGH (ref 70–99)
Glucose-Capillary: 93 mg/dL (ref 70–99)
Glucose-Capillary: 96 mg/dL (ref 70–99)

## 2010-10-22 LAB — CBC
HCT: 36.9 % — ABNORMAL LOW (ref 39.0–52.0)
HCT: 38.4 % — ABNORMAL LOW (ref 39.0–52.0)
HCT: 39.7 % (ref 39.0–52.0)
HCT: 40 % (ref 39.0–52.0)
HCT: 41.4 % (ref 39.0–52.0)
HCT: 42.1 % (ref 39.0–52.0)
HCT: 43.8 % (ref 39.0–52.0)
HCT: 43.9 % (ref 39.0–52.0)
Hemoglobin: 12.5 g/dL — ABNORMAL LOW (ref 13.0–17.0)
Hemoglobin: 12.7 g/dL — ABNORMAL LOW (ref 13.0–17.0)
Hemoglobin: 12.9 g/dL — ABNORMAL LOW (ref 13.0–17.0)
Hemoglobin: 13.1 g/dL (ref 13.0–17.0)
Hemoglobin: 13.2 g/dL (ref 13.0–17.0)
Hemoglobin: 13.7 g/dL (ref 13.0–17.0)
Hemoglobin: 14.1 g/dL (ref 13.0–17.0)
Hemoglobin: 14.4 g/dL (ref 13.0–17.0)
Hemoglobin: 16.2 g/dL (ref 13.0–17.0)
MCH: 28.9 pg (ref 26.0–34.0)
MCH: 29.1 pg (ref 26.0–34.0)
MCH: 29.2 pg (ref 26.0–34.0)
MCH: 29.3 pg (ref 26.0–34.0)
MCH: 29.7 pg (ref 26.0–34.0)
MCH: 30 pg (ref 26.0–34.0)
MCHC: 31.1 g/dL (ref 30.0–36.0)
MCHC: 31.3 g/dL (ref 30.0–36.0)
MCHC: 31.3 g/dL (ref 30.0–36.0)
MCHC: 31.5 g/dL (ref 30.0–36.0)
MCHC: 31.8 g/dL (ref 30.0–36.0)
MCHC: 32 g/dL (ref 30.0–36.0)
MCV: 90.1 fL (ref 78.0–100.0)
MCV: 90.5 fL (ref 78.0–100.0)
MCV: 92.2 fL (ref 78.0–100.0)
MCV: 92.2 fL (ref 78.0–100.0)
MCV: 92.3 fL (ref 78.0–100.0)
MCV: 92.5 fL (ref 78.0–100.0)
MCV: 93.2 fL (ref 78.0–100.0)
MCV: 93.7 fL (ref 78.0–100.0)
Platelets: 204 10*3/uL (ref 150–400)
Platelets: 232 10*3/uL (ref 150–400)
Platelets: 235 10*3/uL (ref 150–400)
RBC: 4.13 MIL/uL — ABNORMAL LOW (ref 4.22–5.81)
RBC: 4.32 MIL/uL (ref 4.22–5.81)
RBC: 4.43 MIL/uL (ref 4.22–5.81)
RBC: 4.49 MIL/uL (ref 4.22–5.81)
RBC: 4.54 MIL/uL (ref 4.22–5.81)
RBC: 4.7 MIL/uL (ref 4.22–5.81)
RBC: 4.76 MIL/uL (ref 4.22–5.81)
RBC: 5.45 MIL/uL (ref 4.22–5.81)
RDW: 13.1 % (ref 11.5–15.5)
RDW: 13.1 % (ref 11.5–15.5)
RDW: 13.5 % (ref 11.5–15.5)
RDW: 13.6 % (ref 11.5–15.5)
WBC: 11.2 10*3/uL — ABNORMAL HIGH (ref 4.0–10.5)
WBC: 12.3 10*3/uL — ABNORMAL HIGH (ref 4.0–10.5)
WBC: 7.1 10*3/uL (ref 4.0–10.5)
WBC: 7.3 10*3/uL (ref 4.0–10.5)
WBC: 7.9 10*3/uL (ref 4.0–10.5)

## 2010-10-22 LAB — URINE MICROSCOPIC-ADD ON

## 2010-10-22 LAB — POCT I-STAT 3, ART BLOOD GAS (G3+)
Acid-Base Excess: 2 mmol/L (ref 0.0–2.0)
Acid-Base Excess: 2 mmol/L (ref 0.0–2.0)
Acid-Base Excess: 3 mmol/L — ABNORMAL HIGH (ref 0.0–2.0)
Bicarbonate: 27.3 mEq/L — ABNORMAL HIGH (ref 20.0–24.0)
Bicarbonate: 31.2 mEq/L — ABNORMAL HIGH (ref 20.0–24.0)
Bicarbonate: 31.3 mEq/L — ABNORMAL HIGH (ref 20.0–24.0)
O2 Saturation: 100 %
Patient temperature: 35.5
Patient temperature: 36.3
Patient temperature: 38
TCO2: 28 mmol/L (ref 0–100)
TCO2: 33 mmol/L (ref 0–100)
TCO2: 36 mmol/L (ref 0–100)
pCO2 arterial: 36.6 mmHg (ref 35.0–45.0)
pCO2 arterial: 71.7 mmHg (ref 35.0–45.0)
pH, Arterial: 7.301 — ABNORMAL LOW (ref 7.350–7.450)
pH, Arterial: 7.319 — ABNORMAL LOW (ref 7.350–7.450)
pH, Arterial: 7.48 — ABNORMAL HIGH (ref 7.350–7.450)
pO2, Arterial: 61 mmHg — ABNORMAL LOW (ref 80.0–100.0)
pO2, Arterial: 98 mmHg (ref 80.0–100.0)

## 2010-10-22 LAB — PROTIME-INR
INR: 1.22 (ref 0.00–1.49)
Prothrombin Time: 15.6 seconds — ABNORMAL HIGH (ref 11.6–15.2)

## 2010-10-22 LAB — HEPARIN LEVEL (UNFRACTIONATED)
Heparin Unfractionated: 0.1 IU/mL — ABNORMAL LOW (ref 0.30–0.70)
Heparin Unfractionated: 0.49 IU/mL (ref 0.30–0.70)
Heparin Unfractionated: 0.49 IU/mL (ref 0.30–0.70)
Heparin Unfractionated: 0.63 IU/mL (ref 0.30–0.70)
Heparin Unfractionated: 0.67 IU/mL (ref 0.30–0.70)
Heparin Unfractionated: 0.92 IU/mL — ABNORMAL HIGH (ref 0.30–0.70)

## 2010-10-22 LAB — CULTURE, BLOOD (ROUTINE X 2)
Culture  Setup Time: 201111180015
Culture: NO GROWTH

## 2010-10-22 LAB — URINALYSIS, ROUTINE W REFLEX MICROSCOPIC
Glucose, UA: 100 mg/dL — AB
Ketones, ur: NEGATIVE mg/dL
Leukocytes, UA: NEGATIVE
Specific Gravity, Urine: 1.025 (ref 1.005–1.030)
pH: 5 (ref 5.0–8.0)

## 2010-10-22 LAB — GRAM STAIN

## 2010-10-22 LAB — DIFFERENTIAL
Basophils Absolute: 0 10*3/uL (ref 0.0–0.1)
Basophils Absolute: 0 10*3/uL (ref 0.0–0.1)
Eosinophils Absolute: 0 10*3/uL (ref 0.0–0.7)
Eosinophils Relative: 0 % (ref 0–5)
Eosinophils Relative: 1 % (ref 0–5)
Lymphocytes Relative: 2 % — ABNORMAL LOW (ref 12–46)
Lymphocytes Relative: 6 % — ABNORMAL LOW (ref 12–46)
Lymphs Abs: 0.2 10*3/uL — ABNORMAL LOW (ref 0.7–4.0)
Lymphs Abs: 0.3 10*3/uL — ABNORMAL LOW (ref 0.7–4.0)
Lymphs Abs: 0.7 10*3/uL (ref 0.7–4.0)
Monocytes Absolute: 1.1 10*3/uL — ABNORMAL HIGH (ref 0.1–1.0)
Monocytes Relative: 10 % (ref 3–12)
Monocytes Relative: 7 % (ref 3–12)
Neutro Abs: 11.5 10*3/uL — ABNORMAL HIGH (ref 1.7–7.7)

## 2010-10-22 LAB — CULTURE, RESPIRATORY W GRAM STAIN

## 2010-10-22 LAB — CK TOTAL AND CKMB (NOT AT ARMC)
CK, MB: 15.1 ng/mL (ref 0.3–4.0)
Relative Index: 14.1 — ABNORMAL HIGH (ref 0.0–2.5)

## 2010-10-22 LAB — PHOSPHORUS: Phosphorus: 1.7 mg/dL — ABNORMAL LOW (ref 2.3–4.6)

## 2010-10-22 LAB — POCT I-STAT, CHEM 8
Calcium, Ion: 1.01 mmol/L — ABNORMAL LOW (ref 1.12–1.32)
Glucose, Bld: 143 mg/dL — ABNORMAL HIGH (ref 70–99)
HCT: 46 % (ref 39.0–52.0)
Hemoglobin: 15.6 g/dL (ref 13.0–17.0)
TCO2: 31 mmol/L (ref 0–100)

## 2010-10-22 LAB — MAGNESIUM: Magnesium: 1.8 mg/dL (ref 1.5–2.5)

## 2010-10-22 LAB — URINE CULTURE: Culture: NO GROWTH

## 2010-10-22 LAB — POCT CARDIAC MARKERS: Troponin i, poc: <0.05 ng/mL (ref 0.00–0.09)

## 2010-10-22 LAB — LACTIC ACID, PLASMA: Lactic Acid, Venous: 3.1 mmol/L — ABNORMAL HIGH (ref 0.5–2.2)

## 2010-10-22 LAB — LEGIONELLA ANTIGEN, URINE: Legionella Antigen, Urine: NEGATIVE

## 2010-10-22 LAB — CARDIAC PANEL(CRET KIN+CKTOT+MB+TROPI)
CK, MB: 13 ng/mL (ref 0.3–4.0)
Relative Index: INVALID (ref 0.0–2.5)
Relative Index: INVALID (ref 0.0–2.5)
Total CK: 94 U/L (ref 7–232)
Troponin I: 0.02 ng/mL (ref 0.00–0.06)

## 2010-10-22 LAB — PROCALCITONIN: Procalcitonin: 0.68 ng/mL

## 2010-10-22 LAB — TROPONIN I: Troponin I: 0.02 ng/mL (ref 0.00–0.06)

## 2010-10-22 LAB — OSMOLALITY: Osmolality: 286 mOsm/kg (ref 275–300)

## 2010-10-22 NOTE — Assessment & Plan Note (Signed)
Summary: 3 month f/u per ckout 07/16/10 tg/ths  Medications Added DUONEB 2.5-0.5 MG/3ML  SOLN (ALBUTEROL-IPRATROPIUM) inhale 1 vial via HHN 3 times daily SIMVASTATIN 40 MG TABS (SIMVASTATIN) 1 by mouth at bedtime ISOSORBIDE MONONITRATE CR 60 MG XR24H-TAB (ISOSORBIDE MONONITRATE) 2 by mouth once daily      Allergies Added: ! * COUMADIN  Visit Type:  Follow-up Primary Provider:  Dr.John Phillips Odor  CC:  no cardiology complaints.  History of Present Illness: [name] returns for PAF and CAD. H e has managed to stay out of the hospital this winter thanks to the good care of Dr Vassie Loll. He has occasional palpitations at night in bed and will feel his heart jump. No angina, no change in DOE. Hstory of hemoptysis on Coumadin, not felt to be a candidate.  Current Medications (verified): 1)  Aspirin 81 Mg Tbec (Aspirin) .... Take 1 Tablet By Mouth Once A Day 2)  Duoneb 2.5-0.5 Mg/35ml  Soln (Albuterol-Ipratropium) .... Inhale 1 Vial Via Hhn 3 Times Daily 3)  Klor-Con 10 10 Meq Cr-Tabs (Potassium Chloride) .Marland Kitchen.. 1 By Mouth With Food Once Daily As Needed When Taking Furosemide 4)  Furosemide 20 Mg Tabs (Furosemide) .Marland Kitchen.. 1 By Mouth Once Daily As Needed 5)  Ropinirole Hcl 1 Mg Tabs (Ropinirole Hcl) .Marland Kitchen.. 1 By Mouth At Bedtime 6)  Diazepam 10 Mg Tabs (Diazepam) .Marland Kitchen.. 1 Tablet Every 8 Hours As Needed 7)  Simvastatin 40 Mg Tabs (Simvastatin) .Marland Kitchen.. 1 By Mouth At Bedtime 8)  Nitrostat 0.4 Mg Subl (Nitroglycerin) .Marland Kitchen.. 1 Tablet Under Tongue At Onset of Chest Pain; You May Repeat Every 5 Minutes For Up To 3 Doses. 9)  Isosorbide Mononitrate Cr 60 Mg Xr24h-Tab (Isosorbide Mononitrate) .... 2 By Mouth Once Daily 10)  Protonix 40 Mg Tbec (Pantoprazole Sodium) .Marland Kitchen.. 1 By Mouth Two Times A Day 11)  Advair Diskus 250-50 Mcg/dose Aepb (Fluticasone-Salmeterol) .... One Puff Twice Daily 12)  Diltiazem Hcl 120 Mg Tabs (Diltiazem Hcl) .... Take 1 Tablet By Mouth Once A Day  Allergies (verified): 1)  ! Azithromycin  (Azithromycin) 2)  ! Hydrocodone 3)  ! * Coumadin  Comments:  Nurse/Medical Assistant: patient brought med list we reviewed [facility]  Past History:  Past Medical History: Last updated: 11/21/2008 1. Home oxygen since 2006. 2. Coronary artery disease status post CABG in 1996. 3. Catheterization in 2002 showing decreased LV function and 2-vessel   coronary artery disease. rpt cath 1/10 nml LVSF, patent grafts 4. Hypertension. 5. Diabetes. 6. Right-sided CVA requiring TPA in 2003. 7. Benign prostatic hypertrophy. 8. GERD.  Past Surgical History: Last updated: 11/21/2008 CABG '96] Cervical disk disease with arthrodesis in 2001 by Dr. Danielle Dess. Removal of sternal wires 2003.  Family History: Last updated: 02-01-2009 Father:deceased chf 75 yo Mother:deceased 32 yo unknw Siblings:1 brother                2 sisters   Social History: Last updated: 07/03/2009 He has smoked as much as 5 packs per day, for the last few years he was smoking about a pack per day and has quit since hospital admission 12/08. 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 Divorced  children 1   Risk Factors: Smoking Status: quit (08/01/2010)  Review of Systems       Negative other than HPI  Vital Signs:  Patient profile:   65 year old male Weight:      154 pounds BMI:     22.82 Pulse rate:   90 /  minute BP sitting:   145 / 92  (left arm)  Vitals Entered By: [name], CNA (October 18, 2010 2:01 PM)  Physical Exam  General:  chronically ill, looks stronger than usual.wearing O2. Head:  normocephalic and atraumatic Neck:  Neck supple, no JVD. No masses, thyromegaly or abnormal cervical nodes. Chest [name]:  no deformities or breast masses noted Lungs:  decreased breath sounds, no rhonchi or wheezes. Heart:  soft S1 and S2, rapid regular rhythm Msk:  decreased ROM.   Pulses:  pulses normal in all 4 extremities Extremities:  No clubbing or cyanosis. Neurologic:  Alert and  oriented x 3. Skin:  Intact without lesions or rashes. Psych:  Normal affect.   Impression & Recommendations:  Problem # 1:  ATRIAL FIBRILLATION (ICD-427.31) Assessment Unchanged  His updated medication list for this problem includes:    Aspirin 81 Mg Tbec (Aspirin) .Marland Kitchen... Take 1 tablet by mouth once a day  Problem # 2:  CORONARY ATHEROSCLEROSIS OF ARTERY BYPASS GRAFT (ICD-414.04) Assessment: Unchanged  His updated medication list for this problem includes:    Aspirin 81 Mg Tbec (Aspirin) .Marland Kitchen... Take 1 tablet by mouth once a day    Nitrostat 0.4 Mg Subl (Nitroglycerin) .Marland Kitchen... 1 tablet under tongue at onset of chest pain; you may repeat every 5 minutes for up to 3 doses.    Isosorbide Mononitrate Cr 60 Mg Xr24h-tab (Isosorbide mononitrate) .Marland Kitchen... 2 by mouth once daily    Diltiazem Hcl 120 Mg Tabs (Diltiazem hcl) .Marland Kitchen... Take 1 tablet by mouth once a day  Problem # 3:  CHRONIC OBSTRUCTIVE PULMONARY DISEASE (ICD-496) Assessment: Unchanged  His updated medication list for this problem includes:    Duoneb 2.5-0.5 Mg/92ml Soln (Albuterol-ipratropium) ..... Inhale 1 vial via hhn 3 times daily    Advair Diskus 250-50 Mcg/dose Aepb (Fluticasone-salmeterol) ..... One puff twice daily  Patient Instructions: 1)  Your physician recommends that you schedule a follow-up appointment in: 6 months 2)  Your physician recommends that you continue on your current medications as directed. Please refer to the Current Medication list given to you today. Prescriptions: SIMVASTATIN 40 MG TABS (SIMVASTATIN) 1 by mouth at bedtime  #90 x 1   Entered by:   [name] Via LPN   Authorized by:   Gaylord Shih, MD, [facility]   Signed by:   [name] Via LPN on 04/54/0981   Method used:   Faxed to ...       [facility] (mail-order)             , [place]         Ph: [phone]       Fax: [phone]   RxID:   [id number] ISOSORBIDE MONONITRATE CR 60 MG XR24H-TAB (ISOSORBIDE MONONITRATE) 2 by mouth once daily  #180 x 1   Entered by:    [name] Via LPN   Authorized by:   Gaylord Shih, MD, [facility]   Signed by:   [name] Via LPN on 02/72/5366   Method used:   Faxed to ...       [facility] (mail-order)             , [place]         Ph: [phone]       Fax: [phone]   RxID:   [id number]

## 2010-10-23 ENCOUNTER — Encounter: Payer: Self-pay | Admitting: *Deleted

## 2010-10-30 ENCOUNTER — Ambulatory Visit: Payer: Self-pay | Admitting: Adult Health

## 2010-11-04 ENCOUNTER — Ambulatory Visit (INDEPENDENT_AMBULATORY_CARE_PROVIDER_SITE_OTHER): Payer: BC Managed Care – PPO | Admitting: Adult Health

## 2010-11-04 ENCOUNTER — Encounter: Payer: Self-pay | Admitting: Adult Health

## 2010-11-04 VITALS — BP 110/60 | HR 84 | Temp 97.1°F | Ht 70.0 in | Wt 153.4 lb

## 2010-11-04 DIAGNOSIS — J4489 Other specified chronic obstructive pulmonary disease: Secondary | ICD-10-CM

## 2010-11-04 DIAGNOSIS — R911 Solitary pulmonary nodule: Secondary | ICD-10-CM

## 2010-11-04 DIAGNOSIS — J984 Other disorders of lung: Secondary | ICD-10-CM

## 2010-11-04 DIAGNOSIS — J449 Chronic obstructive pulmonary disease, unspecified: Secondary | ICD-10-CM

## 2010-11-04 HISTORY — DX: Solitary pulmonary nodule: R91.1

## 2010-11-04 NOTE — Progress Notes (Signed)
  Subjective:    Patient ID: [name], male    DOB: [birth year], 65 y.o.   MRN: [id number]  HPI Comments: 62 /M, ex-smoker for FU of  severe COPD on home O2 since 2006. Pulmonary nodules have appeared & resolved on serial CTs since 11/08 CT of chest 11/08 did not show any evidence of pulmonary embolism but showed a spiculated nodule in the right upper lobe 1.2 x 1.6 cm.   CT chest 06/29/09 >> RLL nodule resolved, but new RUL 7 mm nodule. CTc hest 12/31/09 >> RUL nodule resolved, new 10 mm nodule LUL     Jan 03, 2010 11:17 AM   Desaturates to 87% on second lap.    August 01, 2010 1:43 PM   Admitted 11/17-11/28/11 for Acute hypercarbic on chronic respiratory failure requiring MV.   Prednisone tapered over 2 weeks.  Seen by Cardiology was consulted in the setting of atrial fibrillation  -had hemoptysis in the setting of Coumadin. At the time of discharge, he was in sinus rhythm and will not be continued on anticoagulants.   On nectar thick liquids after dysphagia evaln, tremors better than 'what used to be ', off prednisone CT chest 12/11 - RUL density decreased, LUL density- stable, RLL band like density  new - favor inflammatory.  11/04/2010 -Returns for  3 month follow up COPD - states breathing is doing well. no new complaints.  He has been doing well. No increased dyspnea or edema. Tolerating meds well. No flare since last ov. Mucinex helps his congestion.       Review of Systems   Constitutional:   No  weight loss, night sweats,  Fevers, chills, fatigue, lassitude. HEENT:   No headaches,  Difficulty swallowing,  Tooth/dental problems,  Sore throat,                No sneezing, itching, ear ache, nasal congestion, post nasal drip,   CV:  No chest pain,  Orthopnea, PND, swelling in lower extremities, anasarca, dizziness, palpitations  GI  No heartburn, indigestion, abdominal pain, nausea, vomiting, diarrhea, change in bowel habits, loss of appetite  Resp:   No coughing up of  blood.  No change in color of mucus.  No wheezing.  No chest wall deformity  Skin: no rash or lesions.  GU: no dysuria, change in color of urine, no urgency or frequency.  No flank pain.  MS:  No joint pain or swelling.  No decreased range of motion.     Psych:  No change in mood or affect. No depression or anxiety.  No memory loss.                        Objective:   Physical Exam Gen: Pleasant, thin male chronically ill appearing  in no distress,  normal affect  ENT: No lesions,  mouth clear,  oropharynx clear, no postnasal drip  Neck: No JVD, no TMG, no carotid bruits  Lungs:  Coarse BS diminshed BS in bases   Cardiovascular: RRR, heart sounds normal, no murmur or gallops, no peripheral edema  Abdomen: soft and NT, no HSM,  BS normal  Musculoskeletal: No deformities, no cyanosis or clubbing  Neuro: alert, non focal  Skin: Warm, no lesions or rashes             Assessment & Plan:

## 2010-11-04 NOTE — Assessment & Plan Note (Signed)
Compensated on present regimen NO change in rx  follow up Dr. Vassie Loll  In 3 months.

## 2010-11-04 NOTE — Patient Instructions (Signed)
Continue on same meds  follow up with Dr. Vassie Loll  In 3 months  And As needed

## 2010-11-04 NOTE — Progress Notes (Signed)
o2 sat on RA at rest: 88% o2 sat on o2 w/ exertion: 96% 2L

## 2010-11-04 NOTE — Assessment & Plan Note (Signed)
Plan for follow up CT chest in 01/2011 however pt wants to wait until 12/12  Due to cost- to discuss with Dr. Vassie Loll  On return in 3 months

## 2010-11-25 LAB — BASIC METABOLIC PANEL
BUN: 16 mg/dL (ref 6–23)
BUN: 32 mg/dL — ABNORMAL HIGH (ref 6–23)
BUN: 33 mg/dL — ABNORMAL HIGH (ref 6–23)
CO2: 26 mEq/L (ref 19–32)
CO2: 28 mEq/L (ref 19–32)
CO2: 30 mEq/L (ref 19–32)
CO2: 33 mEq/L — ABNORMAL HIGH (ref 19–32)
Calcium: 8.4 mg/dL (ref 8.4–10.5)
Calcium: 8.7 mg/dL (ref 8.4–10.5)
Calcium: 9 mg/dL (ref 8.4–10.5)
Chloride: 100 mEq/L (ref 96–112)
Chloride: 97 mEq/L (ref 96–112)
Chloride: 97 mEq/L (ref 96–112)
Chloride: 98 mEq/L (ref 96–112)
Chloride: 98 mEq/L (ref 96–112)
Chloride: 99 mEq/L (ref 96–112)
Creatinine, Ser: 0.85 mg/dL (ref 0.4–1.5)
Creatinine, Ser: 0.98 mg/dL (ref 0.4–1.5)
Creatinine, Ser: 1 mg/dL (ref 0.4–1.5)
GFR calc Af Amer: >60 mL/min (ref >60)
GFR calc non Af Amer: >60 mL/min (ref >60)
GFR calc non Af Amer: >60 mL/min (ref >60)
GFR calc non Af Amer: >60 mL/min (ref >60)
GFR calc non Af Amer: >60 mL/min (ref >60)
Glucose, Bld: 101 mg/dL — ABNORMAL HIGH (ref 70–99)
Glucose, Bld: 131 mg/dL — ABNORMAL HIGH (ref 70–99)
Glucose, Bld: 132 mg/dL — ABNORMAL HIGH (ref 70–99)
Glucose, Bld: 84 mg/dL (ref 70–99)
Glucose, Bld: 90 mg/dL (ref 70–99)
Potassium: 4 mEq/L (ref 3.5–5.1)
Potassium: 4 mEq/L (ref 3.5–5.1)
Potassium: 4.4 mEq/L (ref 3.5–5.1)
Potassium: 4.4 mEq/L (ref 3.5–5.1)
Sodium: 130 mEq/L — ABNORMAL LOW (ref 135–145)
Sodium: 133 mEq/L — ABNORMAL LOW (ref 135–145)
Sodium: 134 mEq/L — ABNORMAL LOW (ref 135–145)
Sodium: 135 mEq/L (ref 135–145)
Sodium: 135 mEq/L (ref 135–145)

## 2010-11-25 LAB — CBC
HCT: 36.4 % — ABNORMAL LOW (ref 39.0–52.0)
HCT: 38.1 % — ABNORMAL LOW (ref 39.0–52.0)
HCT: 39.1 % (ref 39.0–52.0)
HCT: 40.3 % (ref 39.0–52.0)
HCT: 43.5 % (ref 39.0–52.0)
HCT: 44.3 % (ref 39.0–52.0)
Hemoglobin: 12.6 g/dL — ABNORMAL LOW (ref 13.0–17.0)
Hemoglobin: 12.8 g/dL — ABNORMAL LOW (ref 13.0–17.0)
Hemoglobin: 13 g/dL (ref 13.0–17.0)
Hemoglobin: 13.1 g/dL (ref 13.0–17.0)
Hemoglobin: 14.8 g/dL (ref 13.0–17.0)
MCHC: 32.5 g/dL (ref 30.0–36.0)
MCHC: 33.1 g/dL (ref 30.0–36.0)
MCHC: 33.2 g/dL (ref 30.0–36.0)
MCHC: 33.4 g/dL (ref 30.0–36.0)
MCHC: 33.9 g/dL (ref 30.0–36.0)
MCHC: 34 g/dL (ref 30.0–36.0)
MCV: 89.4 fL (ref 78.0–100.0)
MCV: 89.8 fL (ref 78.0–100.0)
MCV: 90 fL (ref 78.0–100.0)
MCV: 90 fL (ref 78.0–100.0)
MCV: 90.2 fL (ref 78.0–100.0)
MCV: 90.4 fL (ref 78.0–100.0)
Platelets: 215 10*3/uL (ref 150–400)
Platelets: 229 10*3/uL (ref 150–400)
Platelets: 236 10*3/uL (ref 150–400)
Platelets: 239 10*3/uL (ref 150–400)
Platelets: 273 10*3/uL (ref 150–400)
Platelets: 281 10*3/uL (ref 150–400)
Platelets: 287 10*3/uL (ref 150–400)
RBC: 4.2 MIL/uL — ABNORMAL LOW (ref 4.22–5.81)
RBC: 4.83 MIL/uL (ref 4.22–5.81)
RBC: 4.96 MIL/uL (ref 4.22–5.81)
RDW: 12.6 % (ref 11.5–15.5)
RDW: 12.6 % (ref 11.5–15.5)
RDW: 12.8 % (ref 11.5–15.5)
RDW: 12.8 % (ref 11.5–15.5)
RDW: 12.9 % (ref 11.5–15.5)
RDW: 12.9 % (ref 11.5–15.5)
RDW: 12.9 % (ref 11.5–15.5)
WBC: 13.7 10*3/uL — ABNORMAL HIGH (ref 4.0–10.5)
WBC: 14.2 10*3/uL — ABNORMAL HIGH (ref 4.0–10.5)
WBC: 6.8 10*3/uL (ref 4.0–10.5)
WBC: 6.9 10*3/uL (ref 4.0–10.5)
WBC: 9.3 10*3/uL (ref 4.0–10.5)

## 2010-11-25 LAB — PROTIME-INR
INR: 1.1 (ref 0.00–1.49)
Prothrombin Time: 14.1 seconds (ref 11.6–15.2)

## 2010-11-25 LAB — DIFFERENTIAL
Basophils Relative: 0 % (ref 0–1)
Eosinophils Absolute: 0 10*3/uL (ref 0.0–0.7)
Eosinophils Relative: 0 % (ref 0–5)
Monocytes Absolute: 0.5 10*3/uL (ref 0.1–1.0)
Monocytes Relative: 4 % (ref 3–12)

## 2010-11-25 LAB — HEPARIN LEVEL (UNFRACTIONATED)
Heparin Unfractionated: 0.41 IU/mL (ref 0.30–0.70)
Heparin Unfractionated: 0.59 IU/mL (ref 0.30–0.70)

## 2010-11-25 LAB — GLUCOSE, CAPILLARY
Glucose-Capillary: 100 mg/dL — ABNORMAL HIGH (ref 70–99)
Glucose-Capillary: 100 mg/dL — ABNORMAL HIGH (ref 70–99)
Glucose-Capillary: 104 mg/dL — ABNORMAL HIGH (ref 70–99)
Glucose-Capillary: 105 mg/dL — ABNORMAL HIGH (ref 70–99)
Glucose-Capillary: 106 mg/dL — ABNORMAL HIGH (ref 70–99)
Glucose-Capillary: 110 mg/dL — ABNORMAL HIGH (ref 70–99)
Glucose-Capillary: 114 mg/dL — ABNORMAL HIGH (ref 70–99)
Glucose-Capillary: 118 mg/dL — ABNORMAL HIGH (ref 70–99)
Glucose-Capillary: 119 mg/dL — ABNORMAL HIGH (ref 70–99)
Glucose-Capillary: 130 mg/dL — ABNORMAL HIGH (ref 70–99)
Glucose-Capillary: 154 mg/dL — ABNORMAL HIGH (ref 70–99)
Glucose-Capillary: 88 mg/dL (ref 70–99)
Glucose-Capillary: 99 mg/dL (ref 70–99)

## 2010-11-25 LAB — COMPREHENSIVE METABOLIC PANEL
ALT: 21 U/L (ref 0–53)
AST: 22 U/L (ref 0–37)
Albumin: 4.3 g/dL (ref 3.5–5.2)
Alkaline Phosphatase: 90 U/L (ref 39–117)
GFR calc Af Amer: >60 mL/min (ref >60)
Glucose, Bld: 188 mg/dL — ABNORMAL HIGH (ref 70–99)
Potassium: 5.1 mEq/L (ref 3.5–5.1)
Sodium: 130 mEq/L — ABNORMAL LOW (ref 135–145)
Total Protein: 7.2 g/dL (ref 6.0–8.3)

## 2010-11-25 LAB — CK TOTAL AND CKMB (NOT AT ARMC)
Relative Index: 8.4 — ABNORMAL HIGH (ref 0.0–2.5)
Total CK: 174 U/L (ref 7–232)
Total CK: 268 U/L — ABNORMAL HIGH (ref 7–232)

## 2010-11-25 LAB — CARDIAC PANEL(CRET KIN+CKTOT+MB+TROPI)
Relative Index: 7.2 — ABNORMAL HIGH (ref 0.0–2.5)
Total CK: 261 U/L — ABNORMAL HIGH (ref 7–232)
Troponin I: <0.01 ng/mL (ref 0.00–0.06)

## 2010-11-25 LAB — TROPONIN I: Troponin I: <0.01 ng/mL (ref 0.00–0.06)

## 2010-11-25 LAB — D-DIMER, QUANTITATIVE
D-Dimer, Quant: 0.29 ug/mL-FEU (ref 0.00–0.48)
D-Dimer, Quant: 0.4 ug/mL-FEU (ref 0.00–0.48)

## 2010-12-24 NOTE — Assessment & Plan Note (Signed)
[facility]                             PULMONARY OFFICE NOTE   EZELL, POKE                        MRN:          [id number]  DATE:07/14/2007                            DOB:          08-28-1945    Mr. Payne presents for followup visit after his recent hospital  discharge for COPD exacerbation and dyspnea. He was treated with  steroids and Avelox.  He has completed his rapid steroid taper for about  a week.  His breathing is about 60% of normal.  At his baseline he is  able to walk 50-60 feet.  He has been oxygen dependent for at least the  last 2 years.  Arterial blood gas on 2 liters per nasal cannula was  documented at 7.40, 41, 72.  CT of chest did not show any evidence of  pulmonary embolism but showed a spiculated nodule in the right upper  lobe 1.2 x 1.6 cm.  An echo showed hyperdynamic LV with an EF of 70%.  EKG showed nonspecific ST and T wave abnormalities. Cardiac enzymes were  negative.   PAST MEDICAL HISTORY:  1. Home oxygen x2 years.  2. Coronary artery disease status post CABG in 1996.  3. Catheterization in 2002 showing decreased LV function and 2-vessel      coronary artery disease.  4. Hypertension.  5. Diabetes.  6. Right-sided CVA requiring TPA in 2003.  7. Benign prostatic hypertrophy.  8. GERD.  9. Cervical disk disease with arthrodesis in 2001 by Dr. Danielle Dess.  10.Removal of sternal wires.   ALLERGIES:  None.   SOCIAL HISTORY:  He has smoked as much as 5 packs per day, for the last  few years he was smoking about a pack per day and has quit since his  hospital admission.  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   CURRENT MEDICATIONS:  1. Norvasc 10 mg daily.  2. Protonix 40 mg daily.  3. Combivent MDI two puffs p.r.n.  4. Albuterol nebulizations x4-6 daily p.r.n.  5. Mucinex one tablet b.i.d.   REVIEW OF SYSTEMS:  Reports occasional craving for cigarettes.  Denies  weight loss. Scant clear phlegm. No  wheezing.   PHYSICAL EXAMINATION:  VITAL SIGNS:  Temperature 97.5, blood pressure  130/70, heart rate 87 per minute, oxygen saturation 97% on two liters  nasal cannula. Weight 167.4 pounds.  HEENT:  Class II airway.  NECK:  Supple, no JVD, no lymphadenopathy.  CVS:  S1, S2 normal, systolic ejection murmur 2/6 at the base.  CHEST:  Decreased breath sounds at both bases, no rhonchi.  ABDOMEN:  Soft, nontender, no organomegaly.  EXTREMITIES:  No edema.  NEUROLOGIC:  Nonfocal.   LABORATORY DATA:  BUN and creatinine 19/0.84.  Hemoglobin 14.8. Sodium  136, calcium 8.4.   IMPRESSION:  1. Right upper lobe spiculated nodule.  2. Likely severe chronic obstructive pulmonary disease on home oxygen      for 2 years.  3. Coronary artery disease with systolic congestive heart failure.   RECOMMENDATIONS:  1. We spent quite some time going through  his medication regimen. I      would recommend Advair 250/50 Diskus one puff b.i.d. and Spiriva      rescue inhaler daily.  Hopefully with this regimen he will be able      to cut down use of Combivent and albuterol nebulizations and can      restrict them to p.r.n. for rescue.  2. Pulmonary functions will be estimated on his next visit in a      month's time.  We will also obtain an oxygen saturation on room air      and ambulation and then if this is decreased, perhaps an arterial      blood gas on room air.  3. If his lung function is adequate we will pursue a PET scan to      further define this nodule.  On the other hand, if his lung      function is less than 0.8 liters, we may just elect to follow this      nodule with serial CT imaging. The need for keeping his followup      appointments and following of this nodule up to a period of 2 years      was emphasized to the patient and his sister and they expressed      their understanding.  Possibility of lung cancer versus scar tissue      was also discussed.  This will be reinforced on further  visits      based on his lung function.     Oretha Milch, MD  Electronically Signed    RVA/MedQ  DD: 07/14/2007  DT: 07/14/2007  Job #: [id number]   cc:   Patrica Duel, M.D.

## 2010-12-24 NOTE — Discharge Summary (Signed)
[name], [name]                 ACCOUNT NO.:  [id number]   MEDICAL RECORD NO.:  [id number]          PATIENT TYPE:  INP   LOCATION:  1409                         FACILITY:  [facility]   PHYSICIAN:  Elliot Cousin, M.D.    DATE OF BIRTH:  12/27/1945   DATE OF ADMISSION:  06/25/2007  DATE OF DISCHARGE:  07/01/2007                               DISCHARGE SUMMARY   PRIMARY CARE PHYSICIAN:  Patrica Duel, M.D.   DISCHARGE DIAGNOSES:  1. Oxygen-dependent severe chronic obstructive pulmonary disease with      exacerbation.  2. Right upper lobe spiculated lung nodule suspicious for      bronchiogenic carcinoma per CT scan of the chest.  3. Chronic hypoxia secondary to chronic obstructive pulmonary disease.      The patient's ABG on 2 liters of nasal cannula oxygen on June 28, 2007 revealed a pH of 7.45, pCO2 of 42.7, and pO2 of 64.5.  4. Chest pain, thought to be noncardiac in origin.  Myocardial      infarction was ruled out.  5. Thoracic compression deformities per CT scan of the chest.  6. Hypertension.  (Metoprolol was discontinued secondary to      bronchospasms.)  7. Diet-controlled type 2 diabetes mellitus.  8. Tobacco abuse.  9. Deconditioning.   FOR THE SECONDARY DISCHARGE DIAGNOSES AND PAST MEDICAL HISTORY:  Please  see the dictated history and physical.   DISCHARGE MEDICATIONS:  1. Discontinue metoprolol for now.  2. Norvasc 10 mg daily.  3. Mavik 1 mg daily.  4. Avelox 400 mg daily for 5 more days.  5. Advair Diskus 250/50 1 inhalation b.i.d.  6. Combivent MDI 2 puffs every 4 hours as needed.  7. Albuterol nebulizer as needed.  8. Aspirin 325 mg daily.  9. Vytorin 10/40 mg once daily.  10.Oxygen 2-4 liters per minute, continuous.  11.Protonix 40 mg daily.  12.Mucinex 1 tablet b.i.d.  13.Prednisone taper, take as directed.   DISCHARGE DISPOSITION:  The patient was discharged to home in improved  and stable condition on May 31, 2007.  The patient was  advised to  follow up with pulmonologist Dr. Vassie Loll at [facility] on July 14, 2007 at 2:00 p.m.  The patient was also advised to follow up with his  primary care physician, Dr. Nobie Putnam, in 1 week.  A sleep study is being  arranged and scheduled.   CONSULTATIONS:  Dr. Felipa Evener.   PROCEDURES PERFORMED:  1. Chest x-ray on June 28, 2007.  The results revealed stable      postoperative chest.  No acute findings.  2. CT scan of the chest on June 28, 2007.  The results revealed no      evidence of PE; right upper lobe spiculated nodule suspicious for      bronchogenic carcinoma; numerous thoracic compression fractures      with areas of mild ventral canal encroachment; abnormal right      scapula with cortical thinning and probable pathologic fracture;      left rib sclerotic lesion is indeterminate; trace  of left-sided      pleural fluid/thickening; right bronchial wall thickening and      debris/aspiration in the segmental bronchi; probable lingular      atelectasis.  3. A 2-D echocardiogram performed on June 28, 2007.  The results      revealed overall left ventricular systolic function was      hyperdynamic.  Ejection fraction estimated to be 70-75%.  Left      ventricular wall thickness was mildly increased.  Left atrium was      mildly dilated.  4. Chest x-ray on June 25, 2007.  The results revealed stable      severe COPD, prior CABG, remote thoracic compression fractures, no      acute findings.   HISTORY OF PRESENT ILLNESS:  The patient is a 65 year old man with a  past medical history significant for oxygen-dependent COPD, coronary  artery disease, diet-controlled diabetes mellitus, and history of colon  cancer.  He presented to the emergency department on June 25, 2007  with a chief complaint of burning chest pain and shortness of breath.  When the patient was evaluated in the emergency department, his chest x-  ray revealed stable  severe COPD, but no acute findings.  His cardiac  point of care markers were negative.  His EKG revealed normal sinus  rhythm with nonspecific ST and T-wave abnormalities.  The patient was  therefore admitted for further evaluation and management.   For additional details, please see the dictated history and physical.   HOSPITAL COURSE:  1. CHEST PAIN.  The patient was started on as-needed morphine for      pain, aspirin, and his other cardiac medications with the exception      of metoprolol because of his severe COPD with evidence of      bronchospasms.  For further evaluation, cardiac enzymes were      ordered.  His cardiac enzymes were negative x3 for an acute      myocardial infarction.  As indicated above, his EKG revealed      nonspecific ST and T-wave abnormalities.  His chest x-ray revealed      stable severe COPD with no acute findings.  For an additional      evaluation, a 2-D echocardiogram was ordered.  The results revealed      a hyperdynamic left ventricle, however, his ejection fraction was      within normal limits at 70%.  The patient was started empirically      on Protonix as the pain was thought to be secondary to either      gastroesophageal reflux or the thoracic deformities seen on the CT      of the chest. .  Over the course of the hospitalization, the      patient's pain subsided and then completely resolved.  2. OXYGEN-DEPENDENT SEVERE COPD WITH EXACERBATION. The patient was      very short of breath at the time of the initial assessment.  There      was evidence of bronchospasms.  For treatment, the patient was      started on Atrovent and Xopenex nebulizers, intravenous Solu-      Medrol, and empiric antibiotic treatment with Avelox.  As indicated      above, his chest x-ray revealed no evidence of pneumonia or      congestive heart failure.  Approximately 24 hours after the      hospital admission, an ABG was ordered on 2  liters of nasal cannula       oxygen and the results revealed a pH of 7.4, pCO2 of 41, and pO2 of      72.  For additional evaluation, a CT scan of the chest was ordered      to rule out PE.  The CT scan of the chest was negative for PE.      However, it did reveal a right upper lobe spiculated lung nodule      suspicious for bronchogenic carcinoma.  Given this finding,      pulmonologist, Dr. Molli Knock, was consulted.  Dr. Molli Knock agreed with      the management/treatment of the patient's severe COPD exacerbation.      Per his assessment, the right upper lobe spiculated nodule was not      amenable to fiberoptic bronchoscopy.  He also felt that an      ultrasound-guided or CT-guided biopsy would result in a      pneumothorax.  Therefore,  the patient would need a right upper      lobe wedge resection and a VATS.  However, he felt that the      patient's lung status needed to be maximized prior to referring the      patient to CVTS.  Dr. Molli Knock or colleague plans to see the patient      in the outpatient setting and perform pulmonary function tests.  In      addition, Dr. Molli Knock recommended that the patient undergo an      outpatient sleep study.  The sleep study is currently being      scheduled.  Over the course of the hospitalization, the patient was      started on a additional treatment with Advair and Humibid LA.  The      Solu-Medrol was tapered off and he was started on a prednisone dose      taper.  The metoprolol is still being held secondary to the severe      COPD exacerbation.  It can be restarted per the discretion of the      patient's primary care physician or pulmonologist.  3. RIGHT UPPER LOBE SPICULATED LUNG NODULE SUSPICIOUS FOR BRONCHOGENIC      CARCINOMA.  As above in #2.  4. HYPERTENSION.  As indicated above, the metoprolol was discontinued      during the hospital course secondary to the severe COPD      exacerbation and bronchospasms.  He was therefore started on      Norvasc for blood pressure  control.  The [facility] was continued at 1      mg daily.  Over the course of the hospitalization, the patient's      blood pressures were well controlled.  5. TYPE 2 DIABETES MELLITUS.  The patient has diet-controlled type 2      diabetes mellitus.  During the hospital course, his capillary blood      glucose became elevated in part secondary to steroid therapy.      Sliding scale NovoLog was started and it kept his blood glucose      under control during most of the hospitalization.  His hemoglobin      A1c was assessed and found to be well within normal limits at 5.4.  6. TOBACCO ABUSE.  The patient was strongly advised to stop smoking.      He received formal tobacco cessation counseling during the hospital      course.  The patient voiced understanding and stated that he would      stop.  7. DECONDITIONING.  The physical therapist evaluated the patient      during the hospital course and recommended home physical therapy;      this was ordered.  Because the patient is so deconditioned and has      poor pulmonary reserve, a registered nurse and a Child psychotherapist were      ordered as well.   DISCHARGE LABORATORY:  Sodium 136, potassium 4.1, chloride 100, CO2 30,  glucose 130, BUN 19, creatinine 0.84, calcium 8.4, WBC 10.1, hemoglobin  14.8, platelets 210, total cholesterol 136, triglycerides 61, HDL 53,  LDL 71.      Elliot Cousin, M.D.  Electronically Signed     DF/MEDQ  D:  07/01/2007  T:  07/02/2007  Job:  [id number]   cc:   Patrica Duel, M.D.  Fax: [phone]   Oretha Milch, MD  [address] [place] [address]

## 2010-12-24 NOTE — Cardiovascular Report (Signed)
NAMEJACORION, [name]                 ACCOUNT NO.:  [id number]   MEDICAL RECORD NO.:  [id number]          PATIENT TYPE:  INP   LOCATION:  2922                         FACILITY:  [facility]   PHYSICIAN:  Arturo Morton. Riley Kill, MD, FACCDATE OF BIRTH:  [birth year]   DATE OF PROCEDURE:  09/04/2008  DATE OF DISCHARGE:                            CARDIAC CATHETERIZATION   [name] is a 65 year old gentleman admitted with diaphoresis,  sweating, cough, and increased shortness of breath.  He was thought to  have a COPD exacerbation.  CK-MBs were positive, but troponins were  negative.  Recent echocardiogram demonstrated preserved LV function.  The patient had prior revascularization surgery in 2002.  The current  study was done to assess coronary anatomy.   PROCEDURE:  1. Left heart catheterization.  2. Selective coronary arteriography.  3. Selective left ventriculography.  4. Aortic root aortography.  5. Saphenous vein graft angiography.  6. Selective left internal mammary angiography.   DESCRIPTION OF PROCEDURE:  The patient was brought to the  Catheterization Laboratory and prepped and draped in the usual fashion.  Following informed consent through an anterior puncture, the femoral  artery was easily entered.  A 5-French sheath was placed.  The patient  did cough throughout the procedure with a dense cough.  Because of his  lung disease, visualization was somewhat difficult in the Cath Lab #4.  We used an internal mammary to gain access to the internal mammary  graft.  We had initial non-damping with the standard right catheter in  the radial artery.  There did appear to be some narrowing more distally,  and somewhat irregular lesion distal to that.  We also had difficulty  visualizing any right coronary artery.  We were able to engage the conus  branch.  As a result, central aortic and left ventricular pressures were  measured with pigtail.  Ventriculography was performed in the RAO  projection, which was followed by an aortic root shot.  Following this,  we went back, and upgraded to a 6-French sheath.  We put a guiding  catheter in the radial graft.  There was also concern about the ostium  because he developed damping after the first couple of shots.  We pulled  the catheter out, injected intracoronary nitroglycerin in the region of  the ostium.  Repeat views were obtained.  The mid areas of narrowing  which had been previously observed were now resolved.  The ostium looked  slightly worse, especially compared to the very initial study which did  not appear to be significantly narrow.  As a result of these findings,  it was felt that he probably had a fair amount of spasm in the right  radial graft.  Given the fact that the patient is on a variety of  inhalers, his COPD is still borderline, we elected to complete the  procedure.  Intravascular ultrasound may be eventually recommended, but  at the present time we deferred to try to stabilize him medically  better.  He was taken to the holding area in satisfactory clinical  condition.   HEMODYNAMIC DATA:  1. Initial central aortic pressure is 126/79, mean 100.  2. Left ventricular pressure 117/16.  3. There was no gradient on pullback across aortic valve.   ANGIOGRAPHIC DATA:  1. The left main is free of critical disease.  2. The LAD is totally occluded after the diagonal which is small.  3. The distal LAD and diagonal system appear to fill by the internal      mammary.  Because of his lung disease, it is somewhat difficult to      see, but we were able to get 3 views and visualize the course of      the IMA fairly carefully.  It probably has some distal disease in      the LAD.  There was a long branch that comes off the diagonal that      what appears to supply the distal portion of the inferior wall      and/or possibly up into the AV groove.  4. The circumflex has a tiny first marginal with about 60%  narrowing.      There is segmental plaque of about 30% in the mid marginal and 40%      prior to the point where it trifurcates into 3 marginal branches.  5. The right coronary ostium was not really ever identified despite an      aortic root shot, and multiple attempts to engage it.  6. The right radial artery appears to provide flow to the posterior      descending and then fills the remainder of the right coronary      system.  On the initial shots, there was eccentric irregularity in      the midportion of the graft noted in the RAO view and segmental      narrowing noted in the proximal portion of the graft.  This all      appeared to nearly disappear after intracoronary nitroglycerin,      however, the ostium looked somewhat worse.  As noted previously, we      did pull out, inject locally, and then tried to reengage the right      with somewhat similar findings.  Compared to the very first initial      right radial injection, this appears to potentially represent spasm      as opposed to a high-grade lesion.  We did feel that intravascular      ultrasound would be helpful, but probably not ideal at the present      time.  Ventriculography in the RAO projection revealed vigorous      global systolic function.  7. The aortic root demonstrated no significant aortic regurgitation.   CONCLUSIONS:  1. Severe chronic obstructive pulmonary disease, O2 dependent with      COPD exacerbation.  2. Preserved overall left ventricular systolic function.  3. Elevated CPKs with normal troponins.  4. Continued patency of the internal mammary to the LAD.  5. Continued patency of the native circumflex.  6. Patency of the radial graft to the RCA with multilevel spasm.   PLAN:  1. The patient will be loaded with Plavix.  2. I will discuss with the pulmonary physicians and try to optimize      his current pulmonary situation.  He does not have infiltrates, but      he is getting round-the-clock  steroids.  We will see if we can wean      down some of his bronchodilators.  3. Repeat  study with possible intravascular ultrasound will be      considered when the pulmonary situation is stabilized.      Arturo Morton. Riley Kill, MD, [facility]  Electronically Signed     TDS/MEDQ  D:  09/04/2008  T:  09/04/2008  Job:  [id number]

## 2010-12-24 NOTE — Assessment & Plan Note (Signed)
[facility]                       [place] CARDIOLOGY OFFICE NOTE   [name], [name]                        MRN:          [id number]  DATE:09/25/2008                            DOB:          1945/12/15    PRIMARY CARE PHYSICIAN:  Patrica Duel, MD   PULMONOLOGIST:  Oretha Milch, MD   REASON FOR VISIT:  Post-hospitalization followup.   HISTORY OF PRESENT ILLNESS:  [name] is a 65 year old male patient  with a history of coronary artery disease, status post bypass surgery in  2002, who recently presented to [facility] with chest pain and  shortness of breath.  His CK-MBs were elevated and his troponins were  negative.  He was brought to the cardiac catheterization lab on September 04, 2008, and was noted to have a patent LIMA to the LAD and a patent  native circumflex.  His radial graft to the RCA had multilevel spasm.  He was treated with nitrates.  The pulmonologist saw him and treated him  with steroids and antibiotics for possible COPD exacerbation.  He was  brought back to the cath lab by Dr. Riley Kill on September 08, 2008.  At  this time, his right radial graft to the distal RCA appeared to be  patent without spasm.  He was continued on nitrates.  In the office  today, he notes he is doing well.  He continues to have chest discomfort  from time to time.  He has noted this since his bypass surgery.  It is  associated with shortness of breath.  He takes nitroglycerin which  provides prompt relief.  He has not noticed much benefit to taking  isosorbide.  He is concerned his heart rate has been elevated.  He  denies any palpitations or syncope, near syncope, lightheadedness, or  dizziness.  He notes that his breathing is somewhat improved since  hospitalization.  His coughing is improved.  He has noted some greenish  sputum recently.  He denies fevers, chills, or increased sputum  production.   CURRENT MEDICATIONS:  Protonix 40 mg  b.i.d., Mucinex b.i.d., Simvastatin  40 mg nightly, Lisinopril 5 mg daily, Spiriva daily, Advair b.i.d.,  Plavix 75 mg daily, Isosorbide 60 mg daily,  ReQuip 1 mg nightly, Aspirin 325 mg daily, Xopenex nebs p.r.n.,  Nitroglycerin p.r.n., Xanax p.r.n.   PHYSICAL EXAMINATION:  GENERAL:  He is a well-nourished and well-  developed male in no acute distress.  VITAL SIGNS:  Blood pressure is 96/64, pulse 104, and weight 161 pound.  HEENT:  Normal.  NECK:  Without JVD.  CARDIAC:  Normal S1 and S2.  Distant heart sounds.  Regular rate and  rhythm.  LUNGS:  Decreased breath sounds bilaterally.  No wheezing.  No rales.  ABDOMEN:  Soft and nontender.  EXTREMITIES:  Without edema.  NEUROLOGIC:  He is alert and oriented x3.  Cranial nerves II through XII  are grossly intact.  VASCULAR:  Right and left femoral arteriotomy sites without hematoma or  bruit.   Electrocardiogram revealed sinus rhythm, heart rate of 96, normal axis,  and nonspecific  ST-T wave changes.   ASSESSMENT AND PLAN:  1. Coronary artery disease, status post bypass surgery in 2002.  The      patient recently underwent cardiac catheterization that did show      some spasm in the right radial graft to the distal right coronary      artery.  Otherwise, he had patent bypass grafts and a patent native      circumflex.  Medical therapy has been continued.  I discussed this      case further with Dr. Dietrich Pates.  At this point in time, it does not      appear that he needs long-term Plavix.  He will continue on      aspirin.  We will discontinue his Plavix.  He also continues to      have chest discomfort from time to time.  This has been present      since his bypass surgery.  He seems to have relief with p.r.n.      nitroglycerin.  We will try to titrate his antianginal medicines to      see if he has any symptomatic relief.  He does not have much blood      pressure to work with, so we will discontinue his lisinopril and       increase his isosorbide to 90, and then 120 mg a day.  He will have      a blood pressure check with the nurse in a week.  When he returns      in followup, if he is not having any symptomatic relief, we can      certainly consider changing him over to a calcium-channel blocker.      Diltiazem may be a good drug to use to possibly help lower his      heart rate.  2. Chronic obstructive pulmonary disease.  He has a history of right      upper lobe spiculated nodule.  He follows chronically with Dr.      Vassie Loll.  He should continue follow up as scheduled.  I have advised      him to call Dr. Reginia Naas office, should he have any problems with      increased sputum production or fever.  3. Dyslipidemia.  He continues on simvastatin.  It does not appear      that he had lipids drawn in quite some time.  We will make sure he      has followup lipids prior to his follow up visit with Dr. Daleen Squibb.   DISPOSITION:  The patient will be brought back to follow up with Dr.  Daleen Squibb in 1 month or sooner p.r.n.      Tereso Newcomer, PA-C  Electronically Signed      Gerrit Friends. Dietrich Pates, MD, [facility]  Electronically Signed   SW/MedQ  DD: 09/25/2008  DT: 09/26/2008  Job #: [id number]   cc:   Patrica Duel, M.D.  Oretha Milch, MD

## 2010-12-24 NOTE — Assessment & Plan Note (Signed)
[facility]                        CARDIOLOGY OFFICE NOTE   [name], [name]                        MRN:          [id number]  DATE:10/25/2008                            DOB:          01-11-1946    [name] comes back today for a followup.  He was seen in the office on  September 25, 2008 for post-hospital visit.   His meds at that time were adjusted and that his lisinopril was  discontinued, his isosorbide was increased to 120 mg q.a.m.   Since that time, he has been doing fairly well.  He did have some lower  extremity edema.  Dr. Nobie Putnam start him on furosemide 20 mg p.o. daily  as well as potassium 10 mEq p.o. daily.   PHYSICAL EXAMINATION:  VITAL SIGNS:  His blood pressure today is 115/76,  his pulse is 92 and regular, his sats 96% on 2.5 L of nasal cannula, and  weight is stable at 160.  GENERAL:  He is chronically ill.  NECK:  No JVD.  Carotid upstrokes were equal and bilateral without  bruits.  Thyroid is not enlarged.  Trachea is midline.  LUNGS:  Decreased breath sounds throughout, mild expiratory wheezes.  He  coughs a lot during his exam.  He is dyspneic even at rest.  HEART:  A poorly appreciated PMI.  Normal S1 and S2.  No gallop.  ABDOMEN:  Soft.  Good bowel sounds.  EXTREMITIES:  No cyanosis, clubbing, or significant edema.  Venous  varicosities are present.  NEUROLOGIC:  Intact.   ASSESSMENT AND PLAN:  [name] was stable from my standpoint.  I have  made no changes in his medical program.  We will see him back again in 3  months.     Thomas C. Daleen Squibb, MD, [facility]  Electronically Signed    TCW/MedQ  DD: 10/25/2008  DT: 10/26/2008  Job #: [id number]   cc:   [name], M.D.

## 2010-12-24 NOTE — Assessment & Plan Note (Signed)
[facility]                       [place] CARDIOLOGY OFFICE NOTE   CLERENCE, GUBSER                        MRN:          [id number]  DATE:01/23/2009                            DOB:          10-09-1945    Mr. Theard comes in today for followup.  He is wearing his O2 and has  really no complaints other than some chronic chest pain which does not  sound ischemic.  He was up on his roof this morning fixing his rotary  antenna at 6:00 a.m.!   He is taking all of his medications and seems compliant.  Please refer  to the previous notes and also the maintenance medication list for his  list.   PHYSICAL EXAMINATION:  VITAL SIGNS:  His blood pressure 130/80, his  pulse is 66 today.  His weight is 158, down 2.  Respiratory rate is 24  and slightly dyspneic and labored.  He is wearing O2.  GENERAL:  He is chronically ill appearing.  SKIN:  Grayish.  NECK:  No major JVD.  Carotids are full.  Thyroid is not enlarged.  CHEST:  Lungs to have decreased breath sounds throughout.  HEART:  Regular rate and rhythm.  Soft S1 and S2.  ABDOMEN:  Soft.  EXTREMITIES:  Only trace edema.  Pulses were present, but reduced.  NEUROLOGIC:  Grossly intact.   Mr. Lad continues to remain fairly stable with his coronary artery  disease.  We have made no changes at this point in his medications.   We will plan on seeing him back again in 6 months.     Thomas C. Daleen Squibb, MD, [facility]  Electronically Signed    TCW/MedQ  DD: 01/23/2009  DT: 01/24/2009  Job #: [id number]   cc:   Patrica Duel, M.D.

## 2010-12-24 NOTE — H&P (Signed)
NAMEMICKEL, SCHREUR                 ACCOUNT NO.:  [id number]   MEDICAL RECORD NO.:  [id number]          PATIENT TYPE:  INP   LOCATION:  2922                         FACILITY:  [facility]   PHYSICIAN:  Christell Faith, MD   DATE OF BIRTH:  Jan 30, 1946   DATE OF ADMISSION:  09/01/2008  DATE OF DISCHARGE:                              HISTORY & PHYSICAL   CARDIOLOGIST:  Maisie Fus C. Daleen Squibb, MD, [facility]   PRIMARY CARE PHYSICIAN:  Patrica Duel, MD   CHIEF COMPLAINT:  Shortness of breath.   HISTORY OF PRESENT ILLNESS:  This is a 65 year old white man with end-  stage COPD and history of coronary artery disease, status post two-  vessel CABG, who was working in his basement today when he became  acutely diaphoretic and very short of breath.  His daughter notes he was  cool and clammy.  Apparently, he reported chest pain to the emergency  room physicians, although he denies that to me at this point.  He does  state that he feels tight in his chest with inspiration.  The emergency  room team was concerned for angina and requests admission with  Cardiology.  The patient has not seen a cardiologist in 8 years since  his bypass surgery in 2002 and does not report any exertional chest  discomfort.  He denies lower extremity edema, recent immobilization, or  recent travel.  He is functional class III due to his COPD.  He is on  home oxygen 24 hours a day.  He lives alone.  His pulmonologist is Dr.  Vassie Loll.   PAST MEDICAL HISTORY:  1. Severe emphysema for which he is on home oxygen.  2. History of CABG in 2002 with LIMA to LAD and radial to PDA.  3. Echocardiogram in November 2008, showed ejection fraction of 70-      75%.  4. Hypertension.  5. Hyperlipidemia.  6. History of dysphagia, now resolved.   SOCIAL HISTORY:  He lives in [place] alone.  He is a retired Scientist, water quality.  He quit smoking 14 months ago, prior to that he was a very  heavy smoker.  He is accompanied today by his sister and brother.   His  brother also has COPD.   ALLERGIES:  No known drug allergies.   MEDICINES:  Zocor, lisinopril, aspirin, albuterol, and Atrovent.  He  does not know the doses.   REVIEW OF SYSTEMS:  Positive for night sweats and occasional fevers.  No  weight loss.  No hemoptysis.  He does have chronic cough, chronic  dyspnea, and occasionally arrest definitely with exertion.  He has some  BPH symptoms and occasional GERD.   PHYSICAL EXAMINATION:  VITAL SIGNS:  Temperature 97.2; pulse initially  116, repeat 105; respiratory rate initially 28, repeat 18; blood  pressure 146/95; and saturation 97% on 3 L.  GENERAL:  This is a thin white male and appears older than his stated  age.  He has a barrel chest consistent with emphysema.  HEENT:  Pupils are round and reactive.  Dentition is poor.  NECK:  Supple.  External jugular vein is distended.  Internal jugular is  difficult to assess.  No carotid bruits.  CARDIAC:  Tachycardic rate.  Heart tones are distant secondary to barrel  chest.  PULMONARY:  Diminished breath sounds in all lung fields.  There are no  focal areas of wheezing or rales.  ABDOMEN:  Soft, nontender, and nondistended.  There is a midline bruit,  but no pulsatile mass.  EXTREMITIES:  Cool.  SKIN:  Shiny and hairless consistent with probable peripheral vascular  disease.  NEUROLOGIC:  5/5 strength in all 4 extremities, although he does have  some generalized muscle wasting secondary to his chronic health  problems.  No acute joint deformities or effusions.  No rash or  bruising.   DIAGNOSTIC TESTS:  Chest x-ray shows COPD with no focal airspace  disease.  EKG shows sinus tachycardia 113 beats a minute with evidence  of right atrial enlargement.   LABORATORIES:  White blood cells 16.5, hemoglobin 14.8, and platelets  335.  Sodium 133, potassium 4.9, BUN 34, creatinine 1.1, and glucose  152.  CK-MB 10 and troponin less than 0.05.   IMPRESSION:  This is a 65 year old white man  with diaphoresis and  shortness of breath today, who reports tightness in his chest with  inspiration.  This is all probably most consistent with a chronic  obstructive pulmonary disease exacerbation; however, angina cannot be  excluded.   PLAN:  1. Admit to a Telemetry Unit and rule out myocardial infarction by      cycling serial EKGs and cardiac enzymes.  2. Continue his aspirin and statin.  We will check a BNP to rule out      heart failure and we will check an echocardiogram to evaluate for      change in the ejection fraction.  3. We will consult his pulmonologist, Dr. Vassie Loll for further      recommendations.  I will empirically place the patient on      antibiotics, reinitiate Advair and Spiriva and use albuterol as      needed.  He may require prednisone burst.  4. Rule out pulmonary embolism by checking the D-dimer.  5. He may need a cardiac stress test.  6. He will be covered with sliding scale insulin.  7. We will check TSH.  8. We will attempt to obtain a sputum culture if possible.      Christell Faith, MD  Electronically Signed     NDL/MEDQ  D:  09/01/2008  T:  09/02/2008  Job:  [id number]

## 2010-12-24 NOTE — Discharge Summary (Signed)
NAMEJMARI, PELC                 ACCOUNT NO.:  [id number]   MEDICAL RECORD NO.:  [id number]          PATIENT TYPE:  INP   LOCATION:  2037                         FACILITY:  [facility]   PHYSICIAN:  Jesse Sans. Wall, MD, FACCDATE OF BIRTH:  09-06-1945   DATE OF ADMISSION:  09/01/2008  DATE OF DISCHARGE:  09/11/2008                               DISCHARGE SUMMARY   PRIMARY CARDIOLOGIST:  Maisie Fus C. Daleen Squibb, MD, [facility]   PRIMARY CARE PHYSICIAN:  Patrica Duel, MD   PULMONOLOGIST:  Comer Locket. Vassie Loll, MD   DISCHARGE DIAGNOSES:  1. Coronary artery disease status post non-Q-wave myocardial      infarction this admission, status post cardiac catheterization      initially on September 04, 2008.  The patient with preserved overall      left ventricular systolic function, continued patency of the      internal mammary to the left anterior descending, continued patency      of the native circumflex, patency of the radial graft to the right      coronary artery with multilevel spasms.      a.     Return to the cardiac catheterization lab on September 08, 2008 for angiography of the right radial graft to the distal right       coronary artery.  At that time, the ostium as well as the       remainder of the graft appeared to be widely patent with good       runoff in the posterior descending artery and the right coronary       artery distally was unchanged from previous study.  For further       details, see cath report.  2. Chronic obstructive pulmonary disease exacerbation.  The patient      evaluated by Pulmonary this admission.  The patient treated with      antibiotics and adjustments in pulmonary medications made.  3. A 2-D echocardiogram showing left ventricular systolic function      normal.  4. O2 dependency.   PAST MEDICAL HISTORY:  1. Coronary artery disease status post CABG in 2002.  2. Severe emphysema, O2 dependent.  3. Hypertension.  4. Hyperlipidemia.  5. History of dysphagia.  6.  History of tobacco use.  7. History of colon cancer status post colostomy.  8. Right brain CVA, treated with tPA in 2003.  9. Hypothyroidism.  10.BPH.  11.History of bradycardia.  12.GERD/esophageal stricture status post dilatation.  13.Cholecystectomy.  14.Removal of sternal wires x2 secondary to painful sternal wires      status post CABG.  Removal of sternal wires done in 2003.  15.Anterior cervical diskectomy, 2001.   HOSPITAL COURSE:  Mr. Bascomb is a 65 year old Caucasian gentleman with  past medical history as stated above who presented on day of admission  complaining of diaphoresis and dyspnea.  Family stated the patient was  cool and clammy.  The patient reported chest pain and presented to the  emergency room for further evaluation.  Note, the patient had not seen a  cardiologist in 8 years since his bypass surgery in 2002.  He is a  functional class level III due to his COPD and home oxygen dependent  24/7, he lives alone.  Chest x-ray showed COPD.  EKG, tachycardia with  evidence of right atrial enlargement.  WBC 16.5.  CK-MB of 10 and  troponin less than 0.05.  The patient was admitted for observation,  continued his aspirin and statin.  Dr. Vassie Loll was consulted for further  management of COPD.  The patient anticoagulated with heparin per  pharmacy management.  The patient with positive CK-MBs and negative  troponins, scheduled for cardiac catheterization for further evaluation.  Per Pulmonary, the patient treated with azithromycin, Rocephin, and  Avelox.  Per Pulmonary note, the patient with right upper lobe  spiculated nodule, suspicious for bronchogenic carcinoma node in  November 2008.  The patient will need further followup with Pulmonary  when cardiac symptoms improve.  Note, the patient is not a candidate for  surgical procedure.  He may be a candidate for radio ablation.  The  patient went to the cath lab on September 04, 2008.  Results as stated  above.  For further  details, see Dr. Rosalyn Charters cath note.  The patient's  breathing improved with adjustments in pulmonary toileting.  The patient  is scheduled for a relook cath on September 08, 2008.  The patient thought  his breathing improved with antibiotics.  Also on September 08, 2008, the  patient went back to the cath lab with results as stated above,  continued isosorbide mononitrate.  The patient transferred to Telemetry  Unit.  Cardiac rehab worked with the patient.  The patient felt he has  returned to baseline.  Dr. Daleen Squibb in to see the patient on day of  discharge.  The patient was afebrile, blood pressure 109/78, and  saturating 100% on 3 L.  The patient cleared to be discharged home to  follow up with Dr. Dietrich Pates in the [place] office in 2 weeks.  I have  called the office and left the information for office to call the  patient at home with date and time.  The patient has been given the post  cardiac catheterization supplemental instructions.   DISCHARGE MEDICATIONS:  1. Oxygen as previously prescribed.  2. Simvastatin 40.  3. Lisinopril 5.  4. Spiriva inhaler 1 daily.  5. Mucinex DM b.i.d.  6. Advair 1 puff b.i.d.  7. Plavix 75.  8. Isosorbide 60.  9. Requip 1 mg at bedtime.  10.Aspirin 325 daily.  11.Protonix 40 b.i.d.  12.Xopenex nebs every 4 hours as needed.  13.Nitroglycerin p.r.n.  14.Xanax 0.25 mg b.i.d. p.r.n.   The patient has received prescriptions for everything with the exception  of the aspirin and oxygen.   DURATION OF DISCHARGE ENCOUNTER:  Greater than 30 minutes.      Dorian Pod, ACNP      Jesse Sans. Daleen Squibb, MD, [facility]  Electronically Signed    MB/MEDQ  D:  09/11/2008  T:  09/12/2008  Job:  [id number]   cc:   Patrica Duel, M.D.

## 2010-12-24 NOTE — H&P (Signed)
[name]                 ACCOUNT NO.:  [id number]   MEDICAL RECORD NO.:  [id number]          PATIENT TYPE:  EMS   LOCATION:  ED                           FACILITY:  [facility]   PHYSICIAN:  [name], M.D. DATE OF BIRTH:  February 06, 1946   DATE OF ADMISSION:  06/25/2007  DATE OF DISCHARGE:                              HISTORY & PHYSICAL   PRIMARY CARE DOCTOR:  [name], M.D.   Patient is therefore unassigned.   CHIEF COMPLAINT:  Chest pain and chest burning sensation.   HISTORY OF PRESENT ILLNESS:  [name] is a 65 year old gentleman with a  past medical history of coronary artery disease, status post CABG,  hypertension, and dyslipidemia.  He is also home O2 dependent and  indicates that he has some baseline shortness of breath.  Over the past  4 or 5 days, patient indicates that he has experienced a centrally  located burning sensation.  He indicates that the sensation does not  radiate to his neck, down his arm, or to his back.  He also complains of  some shortness of breath which started gradually after he woke up this  morning.  He indicates that by the time he completely dressed himself  and arrived at his primary care doctor's office, his shortness of breath  had progressed. While in his primary care doctor's office, his PCP  became concerned and referred him to the hospital for further  evaluation.  He denies having any fevers.  He does complain of some  occasional nausea and denies having any emesis.   PAST MEDICAL HISTORY:  1. Coronary artery disease, status post CABG in 1996.  2. Hypertension.  3. Diabetes mellitus.  4. Dyslipidemia.  5. Hypothyroidism.  6. Colon cancer, status post colostomy.  7. Right brain CVA, treated with t-PA in 2003.  8. BPH.  9. Bradycardia.  10.GERD.  11.Esophageal stricture, status post dilatation.  12.Patient underwent a cardiac catheterization on Dec 17, 2000 which      revealed moderate to severely decreased left  ventricular systolic      function secondary to ischemic heart disease as well as severe two      vessel coronary artery disease, which identified chronic total      occlusion of the mid left anterior descending artery and subtotal      occlusion of the mid right coronary artery.   PAST SURGICAL HISTORY:  1. Anterior cervical diskectomy and arthrodesis of C3-4 and C4-5,      completed February 18, 2000 by Dr. Barnett Abu.  2. Median sternotomy, extra corporeal circulation, coronary artery      bypass grafting x2 secondary to severe two vessel coronary artery      disease with exertional angina completed on Dec 18, 2000.  3. Removal of sternal wires x2 secondary to painful sternal wires      completed on October 26, 2001.  4. Knee surgery.  5. Laparoscopic cholecystectomy.  6. Cervical spine surgery.  7. CABG in 1996.   ALLERGIES:  1. HYDROCODONE.  2. AZITHROMYCIN.   CURRENT HOME MEDICATIONS:  1.  Advair discus.  2. Albuterol.  3. Aspirin 325 mg p.o. daily.  4. Mavik.  5. Metoprolol tartrate.  6. Vytorin.  7. Spiriva.   SOCIAL HISTORY:  Cigarettes:  The patient admits to starting smoking at  the age of 89.  He currently still smokes cigarettes.  Alcohol:  Patient  denies.  He states that he stopped drinking alcohol eight years ago.  He  only drank occasionally prior to that.   FAMILY HISTORY:  Mother had leukemia.  Father had congestive heart  failure.   REVIEW OF SYSTEMS:  As per HPI.   PHYSICAL EXAMINATION:  GENERAL:  The patient is awake.  He is  cooperative.  He only occasionally shows additional use of accessory  muscles to breathe.  VITAL SIGNS:  Temperature 97.6, blood pressure 171/98, heart rate 100,  respirations 25, O2 sat is 98% on room air.  HEENT:  Normocephalic and atraumatic.  Anicteric.  Extraocular movements  are intact.  Oral mucosa is pink.  No thrush, no exudates.  NECK:  Neck veins are slightly prominent.  No lymphadenopathy, no  thyromegaly.  CARDIAC:   S1 and S2 present.  Regular rate and rhythm.  CHEST:  There is a well-healed old midline surgical scar present.  ABDOMEN:  Flat, soft, nontender, nondistended.  Positive bowel sounds x4  quadrants.  No masses, no hepatosplenomegaly.  EXTREMITIES:  No leg edema.  NEUROLOGIC:  Patient is alert and oriented x3.  Cranial nerves II-XII  are intact, excluding the gag reflux, was not assessed.   LABS:  Patient's CK-MB, POC is 4.7.  Troponin I, POC, is less than 0.05.  BNP is 117.  Patient's sodium is 138, potassium 4.5, chloride 101, CO2  30, glucose 95, BUN 11, creatinine 1, calcium 9.2.  White blood cell  count 4.9, hemoglobin 14.7, hematocrit 42.4, platelet count 223.   The patient had an EKG completed, the results of which reveal normal  sinus rhythm with good RV progression by leads V3, transition being  achieved.  No obvious pathologic Q wave abnormality nor are there any  impressive T wave abnormalities.   The patient's chest x-ray revealed severe but stable COPD.  No acute  findings.   ASSESSMENT/PLAN:  1. Chest pain:  The etiology of this is cardiac versus noncardiac in      origin.  Given the fact that the patient has a history of      gastroesophageal reflux disease, one needs to be concerned that      this may be contributing.  The patient also, however, has a history      of coronary artery disease, status post coronary artery bypass      graft.  Therefore, I will cycle the patient's cardiac markers x3      q.8h. apart, including troponin I as well as CK-MB.  We will start      the patient on a trial of proton pump inhibitors as well as      morphine, oxygen, aspirin, and nitroglycerin.  2. Shortness of breath:  This may be associated with a mild chronic      obstructive pulmonary disease exacerbation.  We will provide the      patient with a trial of IV steroids as well as nebulized breathing      treatments.  3. Hypertension:  The patient's blood pressure is currently  elevated.      We will start the patient on antihypertensive medication for now.  4. History of diabetes  mellitus which is reported to be diet-      controlled.  We will start Accu-Cheks on the patient for now.  5. History of dyslipidemia:  We will check a fasting lipid profile on      the patient.  6. History of gastroesophageal reflux disease:  We will again start      the patient on proton pump inhibitors.  7. Gastrointestinal prophylaxis:  We will provide Protonix.  8. Deep venous thrombosis prophylaxis:  We will provide Lovenox.      [name], M.D.  Electronically Signed     OR/MEDQ  D:  06/25/2007  T:  06/25/2007  Job:  [id number]   cc:   [name], M.D.  Fax: [phone]

## 2010-12-24 NOTE — Cardiovascular Report (Signed)
[name], [name]                 ACCOUNT NO.:  [id number]   MEDICAL RECORD NO.:  [id number]          PATIENT TYPE:  INP   LOCATION:  2922                         FACILITY:  [facility]   PHYSICIAN:  Arturo Morton. Riley Kill, MD, FACCDATE OF BIRTH:  [birth year]   DATE OF PROCEDURE:  09/08/2008  DATE OF DISCHARGE:                            CARDIAC CATHETERIZATION   [name] is a 65 year old gentleman who recently presented with COPD  exacerbation.  He was thought to possibly have unstable coronary  symptoms.  He had markedly improved now with treatment of his COPD.  He  underwent diagnostic catheterization this past week.  His internal  mammary to the LAD was open.  The right radial graft was abnormal.  The  patient was on bronchodilators.  Initial shots suggested that the ostium  was okay but the middle of the graft appeared to have an unstable area  of narrowing, and there was a segmental area of spasm.  After  administration of nitrates, all of the mid segment areas resolved but  the ostium appeared to be worse.  Despite external nitroglycerin, we  were unable to make the ostial abnormality go away.  We were unsure as  to whether this represented a real lesion and/or possibly spasm in the  radial graft.  We felt that this was important because it supplies the  right coronary territory, which is large.  As a result, we felt that the  patient should be brought back to the lab when his COPD settles down.  He was started on oral nitrates.  The plan was to bring him back, use a  4-French catheter, and to administered nitroglycerin before injecting  the RCA graft.  This was thoroughly explained to the patient.  He was  agreeable to proceed.   PROCEDURE:  Angiography of the right radial graft to the distal right  coronary.   DESCRIPTION OF THE PROCEDURE:  The patient was brought to the  catheterization laboratory today.  The left femoral artery was prepped.  Through an anterior puncture, a  4-French sheath was then placed.  We  then placed a 3DRC catheter up near the graft.  External shots were  attempted but did not fill the graft well.  We gave nitroglycerin near  the ostium and then engaged the graft.  During this injection, the  ostium appeared to be widely patent.  There was no damping during the  course of the procedure, particularly the very for shot demonstrated  widely patent graft.  In addition to that, the mid area of spasm, as  well as the mid area of irregularity, appeared to be without significant  narrowing.  As in the previous study, the graft inserted into the distal  right coronary segment, specifically the PDA and was widely patent.  As  a result, we elected not to perform any type of percutaneous  intervention and felt that much of the previous findings were probably  related to right radial spasm.  There were no major complications and  all catheters were subsequently removed, and he was taken to the holding  area in satisfactory clinical condition.   ANGIOGRAPHIC DATA:  The right radial graft has been previously injected  as noted above.  In reviewing the films from the previous study, there  was a beak at the ostium, but this appeared to develop during the course  of the procedure.  However, it has not resolved by nitroglycerin  previously.  The initial shots on the previous study had demonstrated  significant narrowing in the midportion in 2 different locations.  Today, the ostium as well as the remainder of the graft appears to be  widely patent with good runoff into the PDA.  The right coronary artery  distally was unchanged from the previous study.   CONCLUSION:  Continued patency of the right radial graft to the distal  right coronary artery.   DISPOSITION:  At the present time, with the patient's severe COPD, I  will continue him on nitrates.  Continued medical therapy is warranted.      Arturo Morton. Riley Kill, MD, [facility]  Electronically  Signed     TDS/MEDQ  D:  09/08/2008  T:  09/09/2008  Job:  [id number]   cc:   Thomas C. Daleen Squibb, MD, [facility]  CV Laboratory  Patrica Duel, M.D.

## 2010-12-24 NOTE — Consult Note (Signed)
NAMESUHEYB, RAUCCI                 ACCOUNT NO.:  [id number]   MEDICAL RECORD NO.:  [id number]          PATIENT TYPE:  INP   LOCATION:  1409                         [facility]ACILITY:  [facility]   PHYSICIAN:  Felipa Evener, MD  DATE OF BIRTH:  [birth year]   DATE OF CONSULTATION:  06/28/2007  DATE OF DISCHARGE:                                 CONSULTATION   REFERRING PHYSICIAN:  Michaelyn Barter, M.D.   REASON FOR CONSULTATION:  Regarding his dyspnea and right upper lobe  spiculated mass.   HISTORY OF PRESENT ILLNESS:  Mr. Benning is a 65 year old white male,  lifelong smoker, who has been O2 dependent for 2 years.  On his best  day, he can walk approximately 30-50 feet with shortness of breath.  He  noticed an increase in shortness of breath x4 weeks with several days of  increasing chest burning.  He denied fevers, chills, sweats, or purulent  sputum at that time.  Note, he now has tan sputum after 2 days of  antimicrobial therapy, pulmonary toilet, and no cigarettes.  He denies  lower extremity edema, sinus drainage.  Chest x-ray reveals severe  chronic obstructive pulmonary disease, and CT shows right upper lobe  spiculated mass, 1.2 x 1.6 cm, with questionable bronchogenic lung  cancer.  Pulmonary has been asked to evaluate.   PAST MEDICAL HISTORY:  1. Chronic obstructive pulmonary disease, O2 dependent x2 years with      continued tobacco abuse.  2. Coronary artery disease status post CABG in 1996 with a cardiac      catheterization in 2002 with two-vessel coronary artery disease and      decreased left ventricular function.  3. Hypertension.  4. Diabetes mellitus.  5. Increased lipids.  6. Hypothyroidism.  7. He carries a diagnosis of colon cancer with colostomy, but the      patient denies this, and he has no overt evidence of ever having GI      surgical involvement.  8. History of right brain CVA with history of TPA in 2003.  9. Benign prostatic hypertrophy and prostatitis.  10.Gastroesophageal reflux disease.  11.Esophageal stricture with dilatation.  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 Barnett Abu.  13.History of removal of sternal wires due to pain in 2002.  14.Knee surgery.   ALLERGIES:  NONE.   SOCIAL HISTORY:  He is a 66 pack per day smoker.  He is disabled  secondary to his pulmonary status.  Note, he was a former Education officer, environmental.   FAMILY HISTORY:  Positive for leukemia in mother, congestive heart  failure in father.   MEDICATIONS:  His medications are available on his MAR and can be  evaluated.   PHYSICAL EXAMINATION:  GENERAL:  Well-nourished, well-developed white  male with congestive cough and a raspy voice.  VITAL SIGNS:  Blood pressure 140/95, heart rate 113, respiratory rate  18, 2 liters nasal cannula shows 100% saturations, respiratory rate 18-  20.  NECK:  Without JVD.  HEENT:  Poor dentition.  No evidence  of thrush.  CHEST:  Decreased air movement with expiratory wheeze.  Old chest tube  and median sternotomy scars are noted.  CARDIAC:  Heart sounds are regular.  Regular rate and rhythm.  ABDOMEN:  Soft, nontender, with no evidence of abdominal surgery.  GU:  He voids.  EXTREMITIES:  Warm without edema.   RADIOGRAPHIC DATA:  Chest x-ray shows chronic obstructive pulmonary  disease.  CT scan shows severe COPD along with a right upper lobe  spiculated 1.2 x 1.6 mass.   LABORATORY DATA:  Arterial blood gas reveals a pH of 7.45, PCO2 of 42,  PO2 64 on 2 liters nasal cannula.  Hemoglobin 13.9, hematocrit 12.  Glucose 156.  Troponin I 0.01.   IMPRESSION AND PLAN:  1. Acute exacerbation of chronic obstructive pulmonary disease with      severe underlying chronic obstructive pulmonary disease with      continued tobacco abuse now with purulent sputum.  Agree with      continuing his antibiotics, steroids,  nebulized bronchodilators,      and pulmonary toilet.  He needs to quit  smoking.  2. Right upper lobe spiculated mass, 1.2 x 1.6, with questionable      bronchogenic cancer.  This has been evaluated by Dr. Molli Knock.  This      is not amenable to fiberoptic bronchoscopy, and ultrasound-guided      CT will most likely result in a pneumothorax.  Therefore, in the      future he will need a right upper lobe wedge resection and a video-      assisted thoracoscopic surgery.  The best course would be to pursue      cardiovascular and thoracic surgery consult once his pulmonary      status has been maximized.      Devra Dopp, MSN, ACNP      Felipa Evener, MD  Electronically Signed    SM/MEDQ  D:  06/28/2007  T:  06/28/2007  Job:  [id number]

## 2010-12-27 NOTE — Op Note (Signed)
[place] [facility]  Patient:    [name], [name] Visit Number: [id number] MRN: [id number]          Service Type: DSU Location: [facility] 2899 16 Attending Physician:  Charlett Lango Dictated by:   Salvatore Decent Dorris Fetch, M.D. Proc. Date: 10/26/01 Admit Date:  10/26/2001 Discharge Date: 10/26/2001                             Operative Report  PREOPERATIVE DIAGNOSIS:  Painful sternal wires.  POSTOPERATIVE DIAGNOSIS:  Painful sternal wires.  OPERATION PERFORMED:  Removal of sternal wires times two.  SURGEON:  Salvatore Decent. Dorris Fetch, M.D.  ANESTHESIA:  Local with intravenous sedation.  FINDINGS:  Two protruding sternal wires, no evidence of infection.  No foreign body reaction of suture granulomas.  Underlying sternum well-healed.  INDICATIONS FOR PROCEDURE:  [name] is a 65 year old gentleman who is status post coronary artery bypass grafting.  He presented to the office with a complaint, two of the sternal closure wires protruding under the skin and causing pain.  He did not have any infection and there was no redness, but there was tenderness to that area.  He had not noted any clicking or popping or motion of the sternum.  The patient was offered removal of sternal wires under local anesthesia for symptomatic relief.  The indications, risks, benefits and alternatives were discussed.  He wished to proceed with removal of the sternal wires.  DESCRIPTION OF PROCEDURE:  Mr. Wegener was brought to the preop holding area on October 26, 2001.  Intravenous access was obtained.  The patient was taken to the operating room.  Intravenous sedation was given under the direction of anesthesia.  The chest was prepped and draped in the usual fashion.  The lower two sternal wires which were double wires were easily palpable under the skin. 1% plain lidocaine local anesthesia was infiltrated into the skin, subcutaneous tissues and into the periosteum of the sternum.   After ensuring adequate local anesthesia, an incision was made through the previous scar. Hemostasis was achieved with cautery.  The subcutaneous tissue was divided. The wires were identified, but and withdrawn.  Both wires came out easily without undue resistance.  The sternum itself appeared well-healed.  The wound was irrigated with warm saline.  Hemostasis was achieved with cautery.  The subcutaneous tissue was closed with running 2-0 Vicryl suture.  The skin was closed with a running 3-0 Vicryl subcuticular suture.  The patient tolerated the procedure well.  There were no intraoperative complications.  The patient was taken from the operating room to the recovery room in stable condition. Dictated by:   Salvatore Decent Dorris Fetch, M.D. Attending Physician:  Charlett Lango DD:  10/26/01 TD:  10/27/01 Job: [id number]

## 2010-12-27 NOTE — Procedures (Signed)
   NAME:  [name], [name]                           ACCOUNT NO.:  [id number]   MEDICAL RECORD NO.:  [id number]                   PATIENT TYPE:   LOCATION:                                       FACILITY:  [facility]   PHYSICIAN:  Vida Roller, M.D.                DATE OF BIRTH:  03/10/1946   DATE OF PROCEDURE:  DATE OF DISCHARGE:                                    STRESS TEST   INDICATIONS:  The patient has known coronary artery disease status post  coronary artery bypass grafting with LIMA to LAD, left radial to RCA in May  of 2002.  At that time ejection fraction was 30% by catheterization;  however, repeat echocardiogram in August of 2002 showed an ejection fraction  of 50%.  The patient has no new cardiopulmonary complaints.  This is a  routine stress test.   40 mg of Adenosine was induced over four minute protocol with Cardiolite  injected at three minutes.  The patient reported shortness of breath,  flushing which resolved in recovery.  EKG showed no ischemic changes and no  arrhythmias.  Final images and results are pending M.D. review.     Amy Mercy Riding, P.A. [facility]                     Vida Roller, M.D.    AB/MEDQ  D:  11/28/2002  T:  11/28/2002  Job:  [id number]

## 2011-02-27 ENCOUNTER — Ambulatory Visit (INDEPENDENT_AMBULATORY_CARE_PROVIDER_SITE_OTHER): Payer: BC Managed Care – PPO | Admitting: Pulmonary Disease

## 2011-02-27 ENCOUNTER — Encounter: Payer: Self-pay | Admitting: Pulmonary Disease

## 2011-02-27 VITALS — BP 110/82 | HR 85 | Temp 98.1°F | Ht 69.0 in | Wt 145.2 lb

## 2011-02-27 DIAGNOSIS — J449 Chronic obstructive pulmonary disease, unspecified: Secondary | ICD-10-CM

## 2011-02-27 DIAGNOSIS — R911 Solitary pulmonary nodule: Secondary | ICD-10-CM

## 2011-02-27 DIAGNOSIS — J984 Other disorders of lung: Secondary | ICD-10-CM

## 2011-02-27 NOTE — Patient Instructions (Signed)
CT scan in 4 months

## 2011-02-27 NOTE — Progress Notes (Signed)
  Subjective:    Patient ID: [name], male    DOB: 04-Aug-1946, 65 y.o.   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 Pulmonary nodules have appeared & resolved on serial CTs since 11/08  CT of chest 11/08 showed a spiculated nodule in the right upper lobe 1.2 x 1.6 cm.  CT chest 11/10 >> RLL nodule resolved, but new RUL 7 mm nodule.  CT chest 5/11 >> RUL nodule resolved, new 10 mm nodule LUL   Jan 03, 2010 Desaturates to 87% on second lap.   August 01, 2010 1 Admitted 11/17-11/28/11 for Acute hypercarbic on chronic respiratory failure requiring MV. Prednisone tapered over 2 weeks. Seen by Cardiology for atrial fibrillation -had hemoptysis with Coumadin. At discharge, he was in sinus rhythm and was not continued on anticoagulants.  On nectar thick liquids after dysphagia evaln, tremors better than 'what used to be ', off prednisone  CT chest 12/11 - RUL density decreased, LUL density- stable, RLL band like density new - favor inflammatory.    02/27/2011  3 month follow up COPD - states breathing is doing well. no new complaints.  He has been doing well. No increased dyspnea or edema. Tolerating meds well. No flare since last ov. Mucinex helps his congestion. Taking nebs 3-4 times/d Wants to defer CT scan since too expensive   Review of Systems Pt denies any significant  nasal congestion or excess secretions, fever, chills, sweats, unintended wt loss, pleuritic or exertional cp, orthopnea pnd or leg swelling.  Pt also denies any obvious fluctuation in symptoms with weather or environmental change or other alleviating or aggravating factors.    Pt denies any increase in rescue therapy over baseline, denies waking up needing it or having early am exacerbations or coughing/wheezing/ or dyspnea       Objective:   Physical Exam Gen. Pleasant, well-nourished, in no distress ENT - no lesions, no post nasal drip Neck: No JVD, no thyromegaly, no  carotid bruits Lungs: no use of accessory muscles, no dullness to percussion, decreased without rales or rhonchi  Cardiovascular: Rhythm regular, heart sounds  normal, no murmurs or gallops, no peripheral edema Musculoskeletal: No deformities, no cyanosis or clubbing         Assessment & Plan:

## 2011-02-28 NOTE — Assessment & Plan Note (Signed)
Ct meds, O2 If frequent flares, consider roflimulast - hold off for now.

## 2011-02-28 NOTE — Assessment & Plan Note (Signed)
Unfortunately, new nodules keep cropping up on his CT. He wants to decrease frequency of CT scans. [name] discussion with him about lung malignancy surveillance & treatment options with his degree of COPD should one of these nodules turn out to be malignant. We agreed to repeat next in a year's duration i.e in nov'12

## 2011-03-20 ENCOUNTER — Other Ambulatory Visit: Payer: Self-pay

## 2011-03-20 ENCOUNTER — Telehealth: Payer: Self-pay | Admitting: Cardiology

## 2011-03-20 MED ORDER — NITROGLYCERIN 0.4 MG SL SUBL: 0.4000 mg | SUBLINGUAL_TABLET | SUBLINGUAL | Status: DC | PRN

## 2011-03-20 MED ORDER — NITROGLYCERIN 0.4 MG SL SUBL: 0.4000 mg | SUBLINGUAL_TABLET | SUBLINGUAL | Status: AC | PRN

## 2011-03-20 NOTE — Telephone Encounter (Signed)
Patient needs refill on NTG sent to [facility] / tg

## 2011-03-31 ENCOUNTER — Encounter: Payer: Self-pay | Admitting: Cardiology

## 2011-03-31 ENCOUNTER — Ambulatory Visit (INDEPENDENT_AMBULATORY_CARE_PROVIDER_SITE_OTHER): Payer: BC Managed Care – PPO | Admitting: Cardiology

## 2011-03-31 VITALS — BP 131/80 | HR 95 | Resp 21 | Ht 70.0 in | Wt 136.0 lb

## 2011-03-31 DIAGNOSIS — I2589 Other forms of chronic ischemic heart disease: Secondary | ICD-10-CM

## 2011-03-31 DIAGNOSIS — I2510 Atherosclerotic heart disease of native coronary artery without angina pectoris: Secondary | ICD-10-CM

## 2011-03-31 DIAGNOSIS — I4891 Unspecified atrial fibrillation: Secondary | ICD-10-CM

## 2011-03-31 DIAGNOSIS — I25810 Atherosclerosis of coronary artery bypass graft(s) without angina pectoris: Secondary | ICD-10-CM

## 2011-03-31 NOTE — Progress Notes (Signed)
HPI [name] returns today for evaluation and management of his coronary disease and cerebrovascular disease.  Since last visit, he's done well. He has been able to stay out of the hospital. Somehow another,rhe still mowing his grass using a riding lawnmower wearing O2 and heat we've had this summer.  Compliant with his medications.  Other than dyspnea on exertion, no angina or symptoms of ischemia. No symptoms of TIAs.  EKG shows sinus rhythm with left atrial enlargement   Past Medical History  Diagnosis Date  . Hypertension   . On home oxygen therapy   . Coronary artery disease     s/p CABG 1996  . Diabetes mellitus   . CVA (cerebral infarction)     right sided requiring TPA in 2003  . BPH (benign prostatic hypertrophy)   . GERD (gastroesophageal reflux disease)     Past Surgical History  Procedure Date  . Coronary artery bypass graft 1996  . Coronary angioplasty 2002    Decreased LV function and 2 vessel cad -Rpt cath 1/10 nml LVSF, patent grafts  . Arthrodesis 2001    cervical disc disease - Dr Danielle Dess  . Sternal wires removal 2003    Family History  Problem Relation Age of Onset  . Heart failure Father     History   Social History  . Marital Status: Divorced    Spouse Name: N/A    Number of Children: 1  . Years of Education: N/A   Occupational History  . Disabled     Former Education officer, environmental   Social History Main Topics  . Smoking status: Former Smoker -- 2.0 packs/day for 40 years    Types: Cigarettes    Quit date: 07/12/2007  . Smokeless tobacco: Never Used  . Alcohol Use: No  . Drug Use: No  . Sexually Active: Not on file   Other Topics Concern  . Not on file   Social History Narrative  . No narrative on file    Allergies  Allergen Reactions  . Azithromycin     REACTION: sweats, vomiting  . Hydrocodone     REACTION: GI upset  . Warfarin Sodium     Current Outpatient Prescriptions  Medication Sig Dispense Refill  . albuterol (PROVENTIL)  (2.5 MG/3ML) 0.083% nebulizer solution Take 2.5 mg by nebulization 4 (four) times daily.       Marland Kitchen aspirin 81 MG tablet Take 81 mg by mouth daily.        Marland Kitchen dextromethorphan-guaiFENesin (MUCINEX DM) 30-600 MG per 12 hr tablet Take 1 tablet by mouth every 12 (twelve) hours.        . diazepam (VALIUM) 10 MG tablet Take one tablet every 8 hrs as needed       . diltiazem (CARDIZEM CD) 120 MG 24 hr capsule Take 120 mg by mouth daily.       . Fluticasone-Salmeterol (ADVAIR DISKUS) 250-50 MCG/DOSE AEPB Inhale 1 puff into the lungs every 12 (twelve) hours.        Marland Kitchen ipratropium (ATROVENT) 0.02 % nebulizer solution Take 500 mcg by nebulization 4 (four) times daily.       . isosorbide mononitrate (IMDUR) 60 MG 24 hr tablet Take 2 tablets by mouth once daily       . nitroGLYCERIN (NITROSTAT) 0.4 MG SL tablet Place 1 tablet (0.4 mg total) under the tongue every 5 (five) minutes as needed for chest pain. Take one tablet under tongue at onset of chest pain: you may repeat every 5  minutes for up to 3 doses  90 tablet  0  . pantoprazole (PROTONIX) 40 MG tablet Take one by mouth twice daily       . rOPINIRole (REQUIP) 1 MG tablet Take one by mouth at bedtime       . simvastatin (ZOCOR) 40 MG tablet Take 40 mg by mouth at bedtime.        . furosemide (LASIX) 20 MG tablet Take 20 mg by mouth 2 (two) times daily. Take one tablet my mouth once daily as needed      . potassium chloride (KLOR-CON) 10 MEQ CR tablet Take one by mouth with food once daily as needed when taking furosemide         ROS Negative other than HPI.   PE General Appearance: well developed, well nourished in no acute distress, chronically ill, wearing O2 HEENT: symmetrical face, PERRLA,   Neck: no JVD, thyromegaly, or adenopathy, trachea midline Chest: symmetric without deformity Cardiac: regular rate and rhythm, soft S1-S2 Lung: decreased breath sounds throughout Vascular: all pulses full without bruits  Abdominal: nondistended, nontender, good  bowel sounds, no HSM, no bruits Extremities: no cyanosis, clubbing or edema, no sign of DVT, no varicosities  Skin: normal color, no rashes Neuro: alert and oriented x 3, non-focal Pysch: normal affect Filed Vitals:   03/31/11 1358  BP: 131/80  Pulse: 95  Resp: 21  Height: 5\' 10"  (1.778 m)  Weight: 136 lb (61.689 kg)  SpO2: 95%    EKG  Labs and Studies Reviewed.   Lab Results  Component Value Date   WBC 11.2* 07/08/2010   HGB 12.7* 07/08/2010   HCT 39.7 07/08/2010   MCV 92.5 07/08/2010   PLT 222 07/08/2010      Chemistry      Component Value Date/Time   NA 140 07/08/2010 0453   K 4.5 07/08/2010 0453   CL 99 07/08/2010 0453   CO2 35* 07/08/2010 0453   BUN 19 07/08/2010 0453   CREATININE 1.05 07/08/2010 0453      Component Value Date/Time   CALCIUM 8.5 07/08/2010 0453   ALKPHOS 48 07/05/2010 0540   AST 19 07/05/2010 0540   ALT 24 07/05/2010 0540   BILITOT 0.8 07/05/2010 0540       Lab Results  Component Value Date   CHOL 139 08/22/2009   CHOL 139 08/22/2009   Lab Results  Component Value Date   HDL 57 08/22/2009   HDL 57 04/24/7828   Lab Results  Component Value Date   LDLCALC 73 08/22/2009   LDLCALC 73 08/22/2009   Lab Results  Component Value Date   TRIG 46 08/22/2009   TRIG 46 08/22/2009   Lab Results  Component Value Date   CHOLHDL 2.4 Ratio 08/22/2009   No results found for this basename: HGBA1C   Lab Results  Component Value Date   ALT 24 07/05/2010   AST 19 07/05/2010   ALKPHOS 48 07/05/2010   BILITOT 0.8 07/05/2010   Lab Results  Component Value Date   TSH 0.354 06/28/2010

## 2011-03-31 NOTE — Patient Instructions (Signed)
Your physician recommends that you continue on your current medications as directed. Please refer to the Current Medication list given to you today.  Your physician recommends that you schedule a follow-up appointment in: 1 year  

## 2011-03-31 NOTE — Assessment & Plan Note (Signed)
Maintaining sinus rhythm.He goes into this when he has a COPD flare. See previous hospitalization.

## 2011-03-31 NOTE — Assessment & Plan Note (Signed)
Stable. No change in treatment. 

## 2011-04-04 ENCOUNTER — Encounter: Payer: Self-pay | Admitting: Cardiology

## 2011-04-08 ENCOUNTER — Other Ambulatory Visit (HOSPITAL_COMMUNITY): Payer: Self-pay | Admitting: Family Medicine

## 2011-04-08 ENCOUNTER — Ambulatory Visit (HOSPITAL_COMMUNITY)
Admission: RE | Admit: 2011-04-08 | Discharge: 2011-04-08 | Disposition: A | Discharge status: Home / Self Care | Payer: Medicare Other | Source: Ambulatory Visit | Attending: Family Medicine | Admitting: Family Medicine

## 2011-04-08 DIAGNOSIS — M25519 Pain in unspecified shoulder: Secondary | ICD-10-CM

## 2011-04-08 DIAGNOSIS — M949 Disorder of cartilage, unspecified: Secondary | ICD-10-CM | POA: Insufficient documentation

## 2011-04-08 DIAGNOSIS — M899 Disorder of bone, unspecified: Secondary | ICD-10-CM | POA: Insufficient documentation

## 2011-05-20 LAB — DIFFERENTIAL
Basophils Absolute: 0
Basophils Relative: 1
Eosinophils Relative: 2
Lymphocytes Relative: 19

## 2011-05-20 LAB — CARDIAC PANEL(CRET KIN+CKTOT+MB+TROPI)
CK, MB: 12.3 — ABNORMAL HIGH
CK, MB: 9.5 — ABNORMAL HIGH
Relative Index: 7.3 — ABNORMAL HIGH
Total CK: 153
Total CK: 169
Troponin I: 0.01
Troponin I: 0.02

## 2011-05-20 LAB — HEMOGLOBIN A1C
Hgb A1c MFr Bld: 5.4
Mean Plasma Glucose: 115

## 2011-05-20 LAB — BASIC METABOLIC PANEL
BUN: 19
BUN: 11
CO2: 28
Calcium: 9.2
Calcium: 9.2
Chloride: 100
Chloride: 103
Creatinine, Ser: 1.09
GFR calc Af Amer: >60
GFR calc non Af Amer: >60
GFR calc non Af Amer: >60
GFR calc non Af Amer: >60
Glucose, Bld: 130 — ABNORMAL HIGH
Glucose, Bld: 156 — ABNORMAL HIGH
Glucose, Bld: 195 — ABNORMAL HIGH
Glucose, Bld: 95
Potassium: 3.8
Potassium: 4.1
Potassium: 4.5
Sodium: 137
Sodium: 139

## 2011-05-20 LAB — CBC
HCT: 40.7
HCT: 41.4
HCT: 42.7
HCT: 42.4
Hemoglobin: 13.9
Hemoglobin: 14.4
MCHC: 34.6
MCV: 87.9
MCV: 88.6
Platelets: 210
Platelets: 223
RBC: 4.64
RDW: 12.5
RDW: 12.9
RDW: 13
RDW: 12.7
WBC: 12.9 — ABNORMAL HIGH

## 2011-05-20 LAB — BLOOD GAS, ARTERIAL
Acid-Base Excess: 3.5 — ABNORMAL HIGH
Acid-Base Excess: 4.2 — ABNORMAL HIGH
Bicarbonate: 28.9 — ABNORMAL HIGH
Bicarbonate: 29.6 — ABNORMAL HIGH
Drawn by: 103701
O2 Content: 2
O2 Saturation: 95.4
O2 Saturation: 96.3
Patient temperature: 97.8
Patient temperature: 98.6
TCO2: 25.1
TCO2: 25.9
pCO2 arterial: 42.7
pH, Arterial: 7.452 — ABNORMAL HIGH
pO2, Arterial: 81.3

## 2011-05-20 LAB — POCT CARDIAC MARKERS
CKMB, poc: 7
Myoglobin, poc: 196
Operator id: 4661
Operator id: 4661
Troponin i, poc: <0.05
Troponin i, poc: <0.05
Troponin i, poc: <0.05

## 2011-05-20 LAB — LIPID PANEL: Cholesterol: 136

## 2011-05-20 LAB — CK TOTAL AND CKMB (NOT AT ARMC): Relative Index: 6.2 — ABNORMAL HIGH

## 2011-05-28 ENCOUNTER — Other Ambulatory Visit: Payer: Self-pay | Admitting: Cardiology

## 2011-06-27 ENCOUNTER — Ambulatory Visit (INDEPENDENT_AMBULATORY_CARE_PROVIDER_SITE_OTHER)
Admission: RE | Admit: 2011-06-27 | Discharge: 2011-06-27 | Disposition: A | Discharge status: Home / Self Care | Payer: Medicare Other | Source: Ambulatory Visit | Attending: Pulmonary Disease | Admitting: Pulmonary Disease

## 2011-06-27 DIAGNOSIS — R911 Solitary pulmonary nodule: Secondary | ICD-10-CM

## 2011-06-27 DIAGNOSIS — J984 Other disorders of lung: Secondary | ICD-10-CM

## 2011-06-30 ENCOUNTER — Encounter: Payer: Self-pay | Admitting: Pulmonary Disease

## 2011-06-30 ENCOUNTER — Ambulatory Visit (INDEPENDENT_AMBULATORY_CARE_PROVIDER_SITE_OTHER): Payer: Medicare Other | Admitting: Pulmonary Disease

## 2011-06-30 DIAGNOSIS — J984 Other disorders of lung: Secondary | ICD-10-CM

## 2011-06-30 DIAGNOSIS — J449 Chronic obstructive pulmonary disease, unspecified: Secondary | ICD-10-CM

## 2011-06-30 DIAGNOSIS — R911 Solitary pulmonary nodule: Secondary | ICD-10-CM

## 2011-06-30 NOTE — Patient Instructions (Signed)
We reviewed your CT scan Stay on advair

## 2011-06-30 NOTE — Progress Notes (Signed)
  Subjective:    Patient ID: [name], male    DOB: 05-02-1946, 65 y.o.   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 Pulmonary nodules have appeared & resolved on serial CTs since 11/08  CT of chest 11/08 showed a spiculated nodule in the right upper lobe 1.2 x 1.6 cm.  CT chest 11/10 >> RLL nodule resolved, but new RUL 7 mm nodule.  CT chest 5/11 >> RUL nodule resolved, new 10 mm nodule LUL  CT chest 12/11 - RUL density decreased, LUL density- stable, RLL band like density new - favor inflammatory CT 11/12 RLL density resolved, other nodules stable - inflammatory  Admitted 11/17-11/28/11 for Acute hypercarbic on chronic respiratory failure requiring MV. Prednisone tapered over 2 weeks. Seen by Cardiology for atrial fibrillation -had hemoptysis with Coumadin. At discharge, he was in sinus rhythm and was not continued on anticoagulants.  On nectar thick liquids after dysphagia evaln .  06/30/2011 39month follow up COPD - states breathing is doing well. no new complaints.  He has been doing well. No increased dyspnea or edema. Tolerating meds well. No flare since last ov. Mucinex helps his congestion.  Taking nebs 3-4 times/d  CT scan reviewed with pt & sister Compliant with O2   Review of Systems Patient denies significant dyspnea,cough, hemoptysis,  chest pain, palpitations, pedal edema, orthopnea, paroxysmal nocturnal dyspnea, lightheadedness, nausea, vomiting, abdominal or  leg pains      Objective:   Physical Exam  Gen. Pleasant, thin man, in no distress on O2 [place] ENT - no lesions, no post nasal drip Neck: No JVD, no thyromegaly, no carotid bruits Lungs: no use of accessory muscles, no dullness to percussion, decreased without rales or rhonchi  Cardiovascular: Rhythm regular, heart sounds  normal, no murmurs or gallops, no peripheral edema Musculoskeletal: No deformities, no cyanosis or clubbing        Assessment & Plan:

## 2011-07-02 NOTE — Assessment & Plan Note (Signed)
Gold stg 4 COPD Ct advair, O2

## 2011-07-02 NOTE — Assessment & Plan Note (Signed)
Serial CT scans from '08 show inflammatory scarring No more imaging FU required

## 2012-01-16 ENCOUNTER — Encounter: Payer: Self-pay | Admitting: Pulmonary Disease

## 2012-01-16 ENCOUNTER — Ambulatory Visit (INDEPENDENT_AMBULATORY_CARE_PROVIDER_SITE_OTHER): Payer: Medicare Other | Admitting: Pulmonary Disease

## 2012-01-16 VITALS — BP 108/56 | HR 88 | Temp 97.8°F | Ht 70.0 in | Wt 128.4 lb

## 2012-01-16 DIAGNOSIS — R911 Solitary pulmonary nodule: Secondary | ICD-10-CM

## 2012-01-16 DIAGNOSIS — J449 Chronic obstructive pulmonary disease, unspecified: Secondary | ICD-10-CM

## 2012-01-16 NOTE — Progress Notes (Signed)
  Subjective:    Patient ID: [name], male    DOB: [birth year], 66 y.o.   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 Pulmonary nodules have appeared & resolved on serial CTs since 11/08  CT of chest 11/08 showed a spiculated nodule in the right upper lobe 1.2 x 1.6 cm.  CT chest 11/10 >> RLL nodule resolved, but new RUL 7 mm nodule.  CT chest 5/11 >> RUL nodule resolved, new 10 mm nodule LUL  CT chest 12/11 - RUL density decreased, LUL density- stable, RLL band like density new - favor inflammatory  CT 11/12 RLL density resolved, other nodules stable - inflammatory  Admitted 11/17-11/28/11 for Acute hypercarbic on chronic respiratory failure requiring MV. Prednisone tapered over 2 weeks. Seen by Cardiology for atrial fibrillation -had hemoptysis with Coumadin. At discharge, he was in sinus rhythm and was not continued on anticoagulants.  On nectar thick liquids after dysphagia evaln    01/16/2012 7 month follow up COPD - states breathing is doing well. 1 episode of dark blood in sputum 2 ds ago - none since  He has been doing well. No increased dyspnea or edema. Tolerating meds well. No flare since last ov. Mucinex helps his congestion.  Taking nebs 3-4 times/d  Compliant with O2   Last CT 11/12     Review of Systems Patient denies significant dyspnea,cough, hemoptysis,  chest pain, palpitations, pedal edema, orthopnea, paroxysmal nocturnal dyspnea, lightheadedness, nausea, vomiting, abdominal or  leg pains       Objective:   Physical Exam  Gen. Pleasant, thin man, in no distress ENT - no lesions, no post nasal drip, hoarse voice Neck: No JVD, no thyromegaly, no carotid bruits Lungs: no use of accessory muscles, no dullness to percussion, decreased without rales or rhonchi  Cardiovascular: Rhythm regular, heart sounds  normal, no murmurs or gallops, no peripheral edema Musculoskeletal: No deformities, no cyanosis or clubbing          Assessment & Plan:

## 2012-01-16 NOTE — Patient Instructions (Signed)
Stay on advair & oxygen

## 2012-01-17 NOTE — Assessment & Plan Note (Signed)
He has done well considering severity of his COPD Ct meds He will call if hemoptysis recurs or worsens  Not interested in pulm rehab

## 2012-01-17 NOTE — Assessment & Plan Note (Signed)
Serial CT scans from '08 show inflammatory scarring No more imaging FU required 

## 2012-03-30 ENCOUNTER — Encounter: Payer: Self-pay | Admitting: Cardiology

## 2012-03-30 ENCOUNTER — Ambulatory Visit (INDEPENDENT_AMBULATORY_CARE_PROVIDER_SITE_OTHER): Payer: Medicare Other | Admitting: Cardiology

## 2012-03-30 VITALS — BP 113/73 | HR 85 | Wt 133.0 lb

## 2012-03-30 DIAGNOSIS — I4891 Unspecified atrial fibrillation: Secondary | ICD-10-CM

## 2012-03-30 DIAGNOSIS — J449 Chronic obstructive pulmonary disease, unspecified: Secondary | ICD-10-CM

## 2012-03-30 DIAGNOSIS — I10 Essential (primary) hypertension: Secondary | ICD-10-CM

## 2012-03-30 DIAGNOSIS — I25810 Atherosclerosis of coronary artery bypass graft(s) without angina pectoris: Secondary | ICD-10-CM

## 2012-03-30 DIAGNOSIS — E785 Hyperlipidemia, unspecified: Secondary | ICD-10-CM

## 2012-03-30 NOTE — Progress Notes (Signed)
HPI [name] comes in today for the valuation management of his coronary disease. He had bypass surgery in 1996. He is mostly limited by his severe COPD.  He denies any angina or chest discomfort. He's had no palpitations. He has a history of paroxysmal A. fib. This usually occurs when he has a COPD flare.  He denies any bleeding or falls. He said orthopnea, PND or edema.  Past Medical History  Diagnosis Date  . Hypertension   . On home oxygen therapy   . Coronary artery disease     s/p CABG 1996  . Diabetes mellitus   . CVA (cerebral infarction)     right sided requiring TPA in 2003  . BPH (benign prostatic hypertrophy)   . GERD (gastroesophageal reflux disease)     Current Outpatient Prescriptions  Medication Sig Dispense Refill  . albuterol (PROVENTIL) (2.5 MG/3ML) 0.083% nebulizer solution Take 2.5 mg by nebulization 4 (four) times daily.       Marland Kitchen aspirin 81 MG tablet Take 81 mg by mouth daily.        Marland Kitchen dextromethorphan-guaiFENesin (MUCINEX DM) 30-600 MG per 12 hr tablet Take 1 tablet by mouth every 12 (twelve) hours.        . diazepam (VALIUM) 10 MG tablet Take one tablet 4 times a day      . diltiazem (CARDIZEM CD) 120 MG 24 hr capsule Take 120 mg by mouth daily.       . Fluticasone-Salmeterol (ADVAIR DISKUS) 250-50 MCG/DOSE AEPB Inhale 1 puff into the lungs every 12 (twelve) hours.        Marland Kitchen ipratropium (ATROVENT) 0.02 % nebulizer solution Take 500 mcg by nebulization 4 (four) times daily.       . isosorbide mononitrate (IMDUR) 60 MG 24 hr tablet TAKE 2 TABLETS ONCE DAILY  180 tablet  0  . meloxicam (MOBIC) 7.5 MG tablet Take 7.5 mg by mouth 2 (two) times daily.      . nitroGLYCERIN (NITROSTAT) 0.4 MG SL tablet Place 1 tablet (0.4 mg total) under the tongue every 5 (five) minutes as needed for chest pain. Take one tablet under tongue at onset of chest pain: you may repeat every 5 minutes for up to 3 doses  90 tablet  0  . pantoprazole (PROTONIX) 40 MG tablet Take one by mouth  twice daily       . rOPINIRole (REQUIP) 1 MG tablet Take one by mouth at bedtime       . simvastatin (ZOCOR) 40 MG tablet TAKE 1 TABLET AT BEDTIME  90 tablet  0    Allergies  Allergen Reactions  . Azithromycin     REACTION: sweats, vomiting  . Hydrocodone     REACTION: GI upset  . Warfarin Sodium     Family History  Problem Relation Age of Onset  . Heart failure Father     History   Social History  . Marital Status: Divorced    Spouse Name: N/A    Number of Children: 1  . Years of Education: N/A   Occupational History  . Disabled     Former Education officer, environmental   Social History Main Topics  . Smoking status: Former Smoker -- 2.0 packs/day for 40 years    Types: Cigarettes    Quit date: 07/12/2007  . Smokeless tobacco: Never Used  . Alcohol Use: No  . Drug Use: No  . Sexually Active: Not on file   Other Topics Concern  . Not on file  Social History Narrative  . No narrative on file    ROS ALL NEGATIVE EXCEPT THOSE NOTED IN HPI  PE  General Appearance: well developed, well nourished in no acute distress, chronically ill HEENT: symmetrical face, PERRLA,  Neck: no JVD, thyromegaly, or adenopathy, trachea midline Chest: symmetric without deformity Cardiac: PMI non-displaced, RRR, soft S1, S2, no gallop or murmur Lung: Decreased breath sounds throughout. No wheezing. Vascular: all pulses full without bruits  Abdominal: nondistended, nontender, good bowel sounds, no HSM, no bruits Extremities: no cyanosis, clubbing or edema, no sign of DVT, superficial varicosities Skin: normal color, no rashes Neuro: alert and oriented x 3, non-focal Pysch: normal affect  EKG Not repeated BMET    Component Value Date/Time   NA 140 07/08/2010 0453   K 4.5 07/08/2010 0453   CL 99 07/08/2010 0453   CO2 35* 07/08/2010 0453   GLUCOSE 93 07/08/2010 0453   BUN 19 07/08/2010 0453   CREATININE 1.05 07/08/2010 0453   CALCIUM 8.5 07/08/2010 0453   GFRNONAA >60 07/08/2010 0453    GFRAA  Value: >60        The eGFR has been calculated using the MDRD equation. This calculation has not been validated in all clinical situations. eGFR's persistently <60 mL/min signify possible Chronic Kidney Disease. 07/08/2010 0453    Lipid Panel     Component Value Date/Time   CHOL 139 08/22/2009 1808   TRIG 46 08/22/2009 1808   HDL 57 08/22/2009 1808   CHOLHDL 2.4 Ratio 08/22/2009 1808   VLDL 9 08/22/2009 1808   LDLCALC 73 08/22/2009 1808    CBC    Component Value Date/Time   WBC 11.2* 07/08/2010 0453   RBC 4.29 07/08/2010 0453   HGB 12.7* 07/08/2010 0453   HCT 39.7 07/08/2010 0453   PLT 222 07/08/2010 0453   MCV 92.5 07/08/2010 0453   MCH 29.6 07/08/2010 0453   MCHC 32.0 07/08/2010 0453   RDW 13.6 07/08/2010 0453   LYMPHSABS 0.7 07/08/2010 0453   MONOABS 1.1* 07/08/2010 0453   EOSABS 0.1 07/08/2010 0453   BASOSABS 0.0 07/08/2010 0453

## 2012-03-30 NOTE — Assessment & Plan Note (Signed)
Stable. Continue medical therapy. I'll see him back in the office in one year. Again primary problem is pulmonary disease.

## 2012-03-30 NOTE — Patient Instructions (Addendum)
Your physician recommends that you schedule a follow-up appointment in: 1 year  

## 2012-04-03 IMAGING — CR DG CHEST 1V PORT
1 series · 1 of 1 positions shown · non-contrast
Comparison: the previous day's study

CLINICAL DATA: Acute respiratory failure

PORTABLE CHEST - 1 VIEW

[view not recorded]
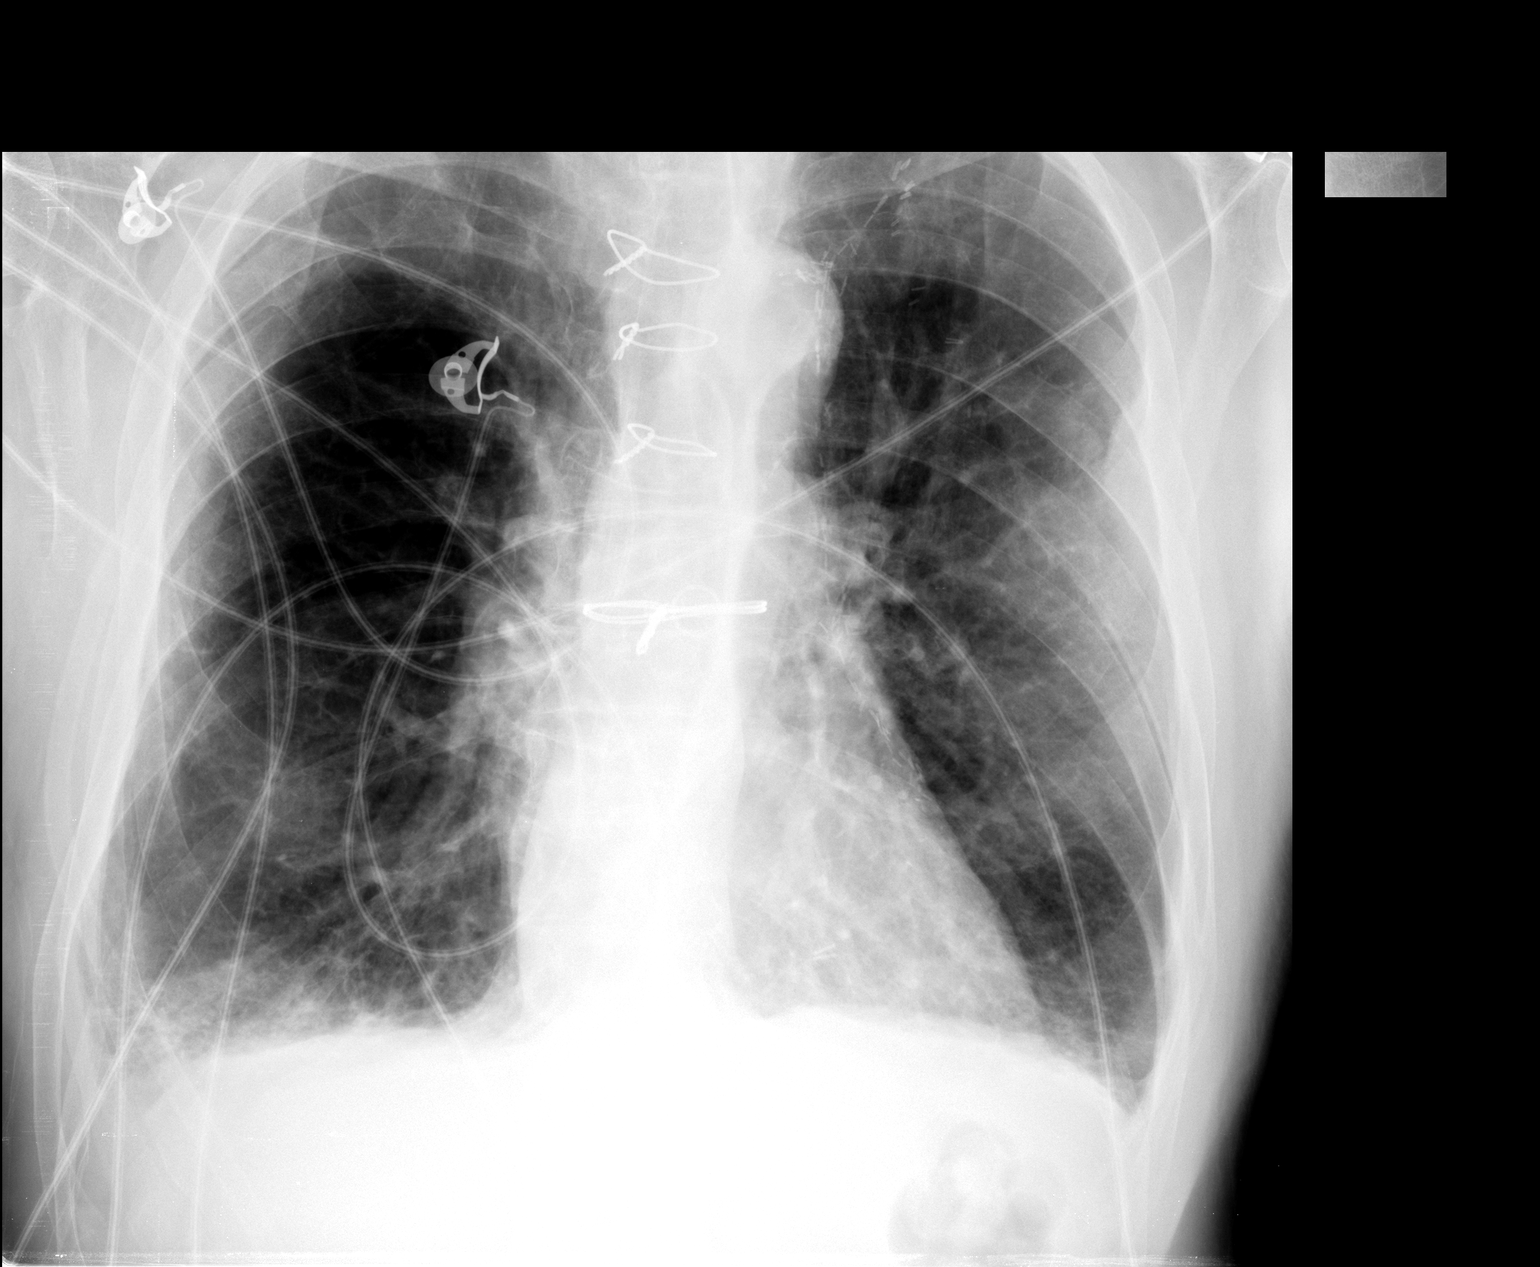

[1 of 1 positions shown; findings below may reference images not displayed]

FINDINGS: The patient has been extubated and the nasogastric tube
removed.  Changes of CABG noted.  Lungs mildly hyperinflated with
attenuated bronchovascular markings.  Patchy atelectasis,
infiltrates or scarring at the lung bases stable.  No definite
effusion.  Heart size normal.
IMPRESSION: 1.  Interval extubation with little change in coarse bibasilar
infiltrates, atelectasis or scarring.

## 2012-04-19 ENCOUNTER — Other Ambulatory Visit (HOSPITAL_COMMUNITY): Payer: Self-pay | Admitting: Family Medicine

## 2012-04-19 DIAGNOSIS — IMO0002 Reserved for concepts with insufficient information to code with codable children: Secondary | ICD-10-CM

## 2012-04-21 ENCOUNTER — Ambulatory Visit (HOSPITAL_COMMUNITY)
Admission: RE | Admit: 2012-04-21 | Discharge: 2012-04-21 | Disposition: A | Discharge status: Home / Self Care | Payer: Medicare Other | Source: Ambulatory Visit | Attending: Family Medicine | Admitting: Family Medicine

## 2012-04-21 DIAGNOSIS — IMO0002 Reserved for concepts with insufficient information to code with codable children: Secondary | ICD-10-CM | POA: Insufficient documentation

## 2012-04-21 DIAGNOSIS — M818 Other osteoporosis without current pathological fracture: Secondary | ICD-10-CM | POA: Insufficient documentation

## 2012-09-06 ENCOUNTER — Encounter: Payer: Self-pay | Admitting: Pulmonary Disease

## 2012-09-06 ENCOUNTER — Ambulatory Visit (INDEPENDENT_AMBULATORY_CARE_PROVIDER_SITE_OTHER): Payer: Medicare Other | Admitting: Pulmonary Disease

## 2012-09-06 VITALS — BP 102/60 | HR 78 | Temp 97.3°F

## 2012-09-06 DIAGNOSIS — J449 Chronic obstructive pulmonary disease, unspecified: Secondary | ICD-10-CM

## 2012-09-06 NOTE — Patient Instructions (Signed)
Stay on advair twice daily Nebs upto 4 times/d

## 2012-09-06 NOTE — Assessment & Plan Note (Signed)
Stay on advair bid, nebs tid Appears stable Vaccines uptodate

## 2012-09-06 NOTE — Progress Notes (Signed)
  Subjective:    Patient ID: [name], male    DOB: [birth year], 67 y.o.   MRN: [id number]  HPI 58 /M, ex-smoker for FU of severe COPD on home O2 since 2006. Pulmonary nodules have appeared & resolved on serial CTs since 11/08  CT of chest 11/08 showed a spiculated nodule in the right upper lobe 1.2 x 1.6 cm.  CT chest 11/10 >> RLL nodule resolved, but new RUL 7 mm nodule.  CT chest 5/11 >> RUL nodule resolved, new 10 mm nodule LUL  CT chest 12/11 - RUL density decreased, LUL density- stable, RLL band like density new - favor inflammatory  CT 11/12 RLL density resolved, other nodules stable - inflammatory  Admitted 11/17-11/28/11 for Acute hypercarbic on chronic respiratory failure requiring MV. Prednisone tapered over 2 weeks. Seen by Cardiology for atrial fibrillation -had hemoptysis with Coumadin. At discharge, he was in sinus rhythm and was not continued on anticoagulants.  On nectar thick liquids after dysphagia evaln   09/06/2012  He has been doing well. No increased dyspnea or edema. Tolerating meds well. No flare since last ov. Mucinex helps his congestion.  Taking nebs 3-4 times/d  Compliant with O2  Has maintained nSR -so no need for coumadin    Review of Systems neg for any significant sore throat, dysphagia, itching, sneezing, nasal congestion or excess/ purulent secretions, fever, chills, sweats, unintended wt loss, pleuritic or exertional cp, hempoptysis, orthopnea pnd or change in chronic leg swelling. Also denies presyncope, palpitations, heartburn, abdominal pain, nausea, vomiting, diarrhea or change in bowel or urinary habits, dysuria,hematuria, rash, arthralgias, visual complaints, headache, numbness weakness or ataxia.     Objective:   Physical Exam  Gen. Pleasant, well-nourished, in no distress ENT - no lesions, no post nasal drip Neck: No JVD, no thyromegaly, no carotid bruits Lungs: no use of accessory muscles, no dullness to percussion, decresaed without  rales or rhonchi  Cardiovascular: Rhythm regular, heart sounds  normal, no murmurs or gallops, no peripheral edema Musculoskeletal: No deformities, no cyanosis or clubbing        Assessment & Plan:

## 2013-03-07 ENCOUNTER — Ambulatory Visit (INDEPENDENT_AMBULATORY_CARE_PROVIDER_SITE_OTHER): Payer: Medicare Other | Admitting: Adult Health

## 2013-03-07 ENCOUNTER — Ambulatory Visit (INDEPENDENT_AMBULATORY_CARE_PROVIDER_SITE_OTHER)
Admission: RE | Admit: 2013-03-07 | Discharge: 2013-03-07 | Disposition: A | Discharge status: Home / Self Care | Payer: Medicare Other | Source: Ambulatory Visit | Attending: Adult Health | Admitting: Adult Health

## 2013-03-07 ENCOUNTER — Encounter: Payer: Self-pay | Admitting: Adult Health

## 2013-03-07 VITALS — BP 96/58 | HR 75 | Temp 97.6°F | Ht 67.0 in | Wt 127.0 lb

## 2013-03-07 DIAGNOSIS — J449 Chronic obstructive pulmonary disease, unspecified: Secondary | ICD-10-CM

## 2013-03-07 DIAGNOSIS — R69 Illness, unspecified: Secondary | ICD-10-CM

## 2013-03-07 NOTE — Progress Notes (Signed)
  Subjective:    Patient ID: [name], male    DOB: 1946/01/11, 67 y.o.   MRN: [id number]  HPI 4 /M, ex-smoker for FU of severe COPD on home O2 since 2006. Pulmonary nodules have appeared & resolved on serial CTs since 11/08  CT of chest 11/08 showed a spiculated nodule in the right upper lobe 1.2 x 1.6 cm.  CT chest 11/10 >> RLL nodule resolved, but new RUL 7 mm nodule.  CT chest 5/11 >> RUL nodule resolved, new 10 mm nodule LUL  CT chest 12/11 - RUL density decreased, LUL density- stable, RLL band like density new - favor inflammatory  CT 11/12 RLL density resolved, other nodules stable - inflammatory  Admitted 11/17-11/28/11 for Acute hypercarbic on chronic respiratory failure requiring MV. Prednisone tapered over 2 weeks. Seen by Cardiology for atrial fibrillation -had hemoptysis with Coumadin. At discharge, he was in sinus rhythm and was not continued on anticoagulants.  On nectar thick liquids after dysphagia evaln   09/06/2012 He has been doing well. No increased dyspnea or edema. Tolerating meds well. No flare since last ov. Mucinex helps his congestion.  Taking nebs 3-4 times/d  Compliant with O2  Has maintained nSR -so no need for coumadin >>cont advair and atrovent neb   03/07/2013 6 month follow up COPD  Pt returns for reports breathing is doing well; no new complaints. Remains active despite O2 dependent. Has a hobby for flying "drones"  . He has several that he builds and flies around the area for fun.  No flare in cough or wheezing. Activity level is at his baseline. Mows grass -riding lawnmower.  No fever, wt loss , chest pain or edema.     Review of Systems neg for any significant sore throat, dysphagia, itching, sneezing, nasal congestion or excess/ purulent secretions, fever, chills, sweats, unintended wt loss, pleuritic or exertional cp, hempoptysis, orthopnea pnd or change in chronic leg swelling. Also denies presyncope, palpitations, heartburn, abdominal pain,  nausea, vomiting, diarrhea or change in bowel or urinary habits, dysuria,hematuria, rash, arthralgias, visual complaints, headache, numbness weakness or ataxia.     Objective:   Physical Exam  Gen. Pleasant,thin , in no distress ENT - no lesions, no post nasal drip Neck: No JVD, no thyromegaly, no carotid bruits Lungs: no use of accessory muscles, no dullness to percussion, decresaed without rales or rhonchi  Cardiovascular: Rhythm regular, heart sounds  normal, no murmurs or gallops, no peripheral edema Musculoskeletal: No deformities, no cyanosis or clubbing        Assessment & Plan:

## 2013-03-07 NOTE — Patient Instructions (Addendum)
Continue on current regimen.  Xray on way out.  follow up Dr. Vassie Loll  In 6 months and As needed

## 2013-03-07 NOTE — Assessment & Plan Note (Signed)
Compensated on present regimen. Continue on Advair twice daily. Continue on Atrovent nebulizers 4 times daily. Followup in 6 months and as needed

## 2013-03-07 NOTE — Assessment & Plan Note (Signed)
Continue on oxygen therapy 

## 2013-03-10 NOTE — Progress Notes (Signed)
Quick Note:  Called spoke with patient, advised of cxr results / recs as stated by TP. Pt verbalized his understanding and denied any questions. ______ 

## 2013-03-28 ENCOUNTER — Ambulatory Visit (INDEPENDENT_AMBULATORY_CARE_PROVIDER_SITE_OTHER): Payer: Medicare Other | Admitting: Cardiology

## 2013-03-28 ENCOUNTER — Encounter: Payer: Self-pay | Admitting: Cardiology

## 2013-03-28 VITALS — BP 117/79 | HR 90 | Ht 67.0 in | Wt 125.0 lb

## 2013-03-28 DIAGNOSIS — I25810 Atherosclerosis of coronary artery bypass graft(s) without angina pectoris: Secondary | ICD-10-CM

## 2013-03-28 DIAGNOSIS — J449 Chronic obstructive pulmonary disease, unspecified: Secondary | ICD-10-CM

## 2013-03-28 DIAGNOSIS — J4489 Other specified chronic obstructive pulmonary disease: Secondary | ICD-10-CM

## 2013-03-28 DIAGNOSIS — I4891 Unspecified atrial fibrillation: Secondary | ICD-10-CM

## 2013-03-28 DIAGNOSIS — I2589 Other forms of chronic ischemic heart disease: Secondary | ICD-10-CM

## 2013-03-28 NOTE — Patient Instructions (Addendum)
Your physician recommends that you schedule a follow-up appointment in: 1 year with Dr Koneswaran You will receive a reminder letter two months in advance reminding you to call and schedule your appointment. If you don't receive this letter, please contact our office.  Your physician recommends that you continue on your current medications as directed. Please refer to the Current Medication list given to you today.     

## 2013-03-28 NOTE — Progress Notes (Signed)
HPI [name] returns today for evaluation and management of his coronary artery disease, history of chronic bypass grafting, history of atrial fibrillation with COPD exacerbations, and O2 dependent COPD.  He continues to do well despite his multiple infirmities. He has a great attitude. He denies any chest pain or palpitations. He denies any orthopnea, PND or lower extremity edema.  Past Medical History  Diagnosis Date  . Hypertension   . On home oxygen therapy   . Coronary artery disease     s/p CABG 1996  . Diabetes mellitus   . CVA (cerebral infarction)     right sided requiring TPA in 2003  . BPH (benign prostatic hypertrophy)   . GERD (gastroesophageal reflux disease)     Current Outpatient Prescriptions  Medication Sig Dispense Refill  . albuterol (PROVENTIL) (2.5 MG/3ML) 0.083% nebulizer solution Take 2.5 mg by nebulization 4 (four) times daily.       Marland Kitchen alendronate (FOSAMAX) 70 MG tablet Take 1 tablet by mouth Once a week.      Marland Kitchen aspirin 81 MG tablet Take 81 mg by mouth daily.        Marland Kitchen dextromethorphan-guaiFENesin (MUCINEX DM) 30-600 MG per 12 hr tablet Take 1 tablet by mouth every 12 (twelve) hours.        . diazepam (VALIUM) 10 MG tablet Take one tablet 4 times a day      . diltiazem (CARDIZEM CD) 120 MG 24 hr capsule Take 120 mg by mouth daily.       . Fluticasone-Salmeterol (ADVAIR DISKUS) 250-50 MCG/DOSE AEPB Inhale 1 puff into the lungs every 12 (twelve) hours.        Marland Kitchen ipratropium (ATROVENT) 0.02 % nebulizer solution Take 500 mcg by nebulization 4 (four) times daily.       . isosorbide mononitrate (IMDUR) 60 MG 24 hr tablet TAKE 2 TABLETS ONCE DAILY  180 tablet  0  . meloxicam (MOBIC) 15 MG tablet Take 15 mg by mouth daily.      . nitroGLYCERIN (NITROSTAT) 0.4 MG SL tablet Place 1 tablet (0.4 mg total) under the tongue every 5 (five) minutes as needed for chest pain. Take one tablet under tongue at onset of chest pain: you may repeat every 5 minutes for up to 3 doses  90  tablet  0  . pantoprazole (PROTONIX) 40 MG tablet Take one by mouth twice daily       . rOPINIRole (REQUIP) 1 MG tablet Take one by mouth at bedtime       . simvastatin (ZOCOR) 40 MG tablet TAKE 1 TABLET AT BEDTIME  90 tablet  0   No current facility-administered medications for this visit.    Allergies  Allergen Reactions  . Azithromycin     REACTION: sweats, vomiting  . Hydrocodone     REACTION: GI upset  . Warfarin Sodium     Family History  Problem Relation Age of Onset  . Heart failure Father     History   Social History  . Marital Status: Divorced    Spouse Name: N/A    Number of Children: 1  . Years of Education: N/A   Occupational History  . Disabled     Former Education officer, environmental   Social History Main Topics  . Smoking status: Former Smoker -- 2.00 packs/day for 40 years    Types: Cigarettes    Quit date: 07/12/2007  . Smokeless tobacco: Never Used  . Alcohol Use: No  . Drug Use: No  .  Sexual Activity: Not on file   Other Topics Concern  . Not on file   Social History Narrative  . No narrative on file    ROS ALL NEGATIVE EXCEPT THOSE NOTED IN HPI  PE  General Appearance: well developed, well nourished in no acute distress, chronically ill and frail, looks much older than stated age HEENT: symmetrical face, PERRLA,  Neck: no JVD, thyromegaly, or adenopathy, trachea midline Chest: symmetric without deformity Cardiac: PMI poorly appreciated, RRR, soft S1 and S2, no gallop or murmur Lung: Decreased breath sounds throughout no wheezes or rhonchi Vascular pulses diminished in the lower chin these are present dependent rubor, multiple varicosities Abdominal: nondistended, nontender, good bowel sounds, no HSM, no bruits Extremities: no cyanosis, clubbing or edema, no sign of DVT, no varicosities  Skin: normal color, no rashes Neuro: alert and oriented x 3, non-focal Pysch: normal affect  EKG  BMET    Component Value Date/Time   NA 140 07/08/2010 0453    K 4.5 07/08/2010 0453   CL 99 07/08/2010 0453   CO2 35* 07/08/2010 0453   GLUCOSE 93 07/08/2010 0453   BUN 19 07/08/2010 0453   CREATININE 1.05 07/08/2010 0453   CALCIUM 8.5 07/08/2010 0453   GFRNONAA >60 07/08/2010 0453   GFRAA  Value: >60        The eGFR has been calculated using the MDRD equation. This calculation has not been validated in all clinical situations. eGFR's persistently <60 mL/min signify possible Chronic Kidney Disease. 07/08/2010 0453    Lipid Panel     Component Value Date/Time   CHOL 139 08/22/2009 1808   TRIG 46 08/22/2009 1808   HDL 57 08/22/2009 1808   CHOLHDL 2.4 Ratio 08/22/2009 1808   VLDL 9 08/22/2009 1808   LDLCALC 73 08/22/2009 1808    CBC    Component Value Date/Time   WBC 11.2* 07/08/2010 0453   RBC 4.29 07/08/2010 0453   HGB 12.7* 07/08/2010 0453   HCT 39.7 07/08/2010 0453   PLT 222 07/08/2010 0453   MCV 92.5 07/08/2010 0453   MCH 29.6 07/08/2010 0453   MCHC 32.0 07/08/2010 0453   RDW 13.6 07/08/2010 0453   LYMPHSABS 0.7 07/08/2010 0453   MONOABS 1.1* 07/08/2010 0453   EOSABS 0.1 07/08/2010 0453   BASOSABS 0.0 07/08/2010 0453

## 2013-03-28 NOTE — Assessment & Plan Note (Signed)
Clinically stable. Continue secondary preventative therapy. He'll followup with Dr.Konaswaren in one year.

## 2013-03-28 NOTE — Assessment & Plan Note (Signed)
No clinical recurrence. Regular rate and rhythm today. Continue aspirin for now.

## 2013-06-27 ENCOUNTER — Other Ambulatory Visit (HOSPITAL_COMMUNITY): Payer: Self-pay | Admitting: Family Medicine

## 2013-06-27 DIAGNOSIS — Z0000 Encounter for general adult medical examination without abnormal findings: Secondary | ICD-10-CM

## 2013-06-30 ENCOUNTER — Ambulatory Visit (HOSPITAL_COMMUNITY)
Admission: RE | Admit: 2013-06-30 | Discharge: 2013-06-30 | Disposition: A | Discharge status: Home / Self Care | Payer: Medicare Other | Source: Ambulatory Visit | Attending: Family Medicine | Admitting: Family Medicine

## 2013-06-30 DIAGNOSIS — Z0000 Encounter for general adult medical examination without abnormal findings: Secondary | ICD-10-CM

## 2013-06-30 DIAGNOSIS — Z136 Encounter for screening for cardiovascular disorders: Secondary | ICD-10-CM | POA: Insufficient documentation

## 2013-06-30 DIAGNOSIS — I700 Atherosclerosis of aorta: Secondary | ICD-10-CM | POA: Insufficient documentation

## 2013-10-11 ENCOUNTER — Encounter: Payer: Self-pay | Admitting: Pulmonary Disease

## 2013-10-11 ENCOUNTER — Ambulatory Visit (INDEPENDENT_AMBULATORY_CARE_PROVIDER_SITE_OTHER): Payer: Medicare Other | Admitting: Pulmonary Disease

## 2013-10-11 VITALS — BP 116/78 | HR 75 | Temp 97.6°F | Ht 67.0 in | Wt 136.0 lb

## 2013-10-11 DIAGNOSIS — J449 Chronic obstructive pulmonary disease, unspecified: Secondary | ICD-10-CM

## 2013-10-11 MED ORDER — BUDESONIDE-FORMOTEROL FUMARATE 160-4.5 MCG/ACT IN AERO: 2.0000 | INHALATION_SPRAY | Freq: Two times a day (BID) | RESPIRATORY_TRACT | Status: DC

## 2013-10-11 NOTE — Patient Instructions (Signed)
Check satn on 2L cont  Can use 3L portable when walking & cont when at home STOP advair & use symbicort 160 2 puffs twice daily instead (sample & Rx for mail) DECREASE Valium - thrice daily x 2 weeks, then twice daily x 2 weeks , then once daily if tolerated

## 2013-10-11 NOTE — Assessment & Plan Note (Signed)
Check satn on 2L cont  Can use 3L portable when walking & cont when at home STOP advair & use symbicort 160 2 puffs twice daily instead (sample & Rx for mail) DECREASE Valium - thrice daily x 2 weeks, then twice daily x 2 weeks , then once daily if tolerated 

## 2013-10-11 NOTE — Progress Notes (Signed)
   Subjective:    Patient ID: [name], male    DOB: [birth year], 68 y.o.   MRN: [id number]  HPI  [age] /M, ex-smoker for FU of severe COPD on home O2 since 2006. Pulmonary nodules have appeared & resolved on serial CTs since 11/08  CT of chest 11/08 showed a spiculated nodule in the right upper lobe 1.2 x 1.6 cm.  CT chest 11/10 >> RLL nodule resolved, but new RUL 7 mm nodule.  CT chest 5/11 >> RUL nodule resolved, new 10 mm nodule LUL  CT chest 12/11 - RUL density decreased, LUL density- stable, RLL band like density new - favor inflammatory  CT 11/12 RLL density resolved, other nodules stable - inflammatory  Admitted 11/17-11/28/11 for Acute hypercarbic on chronic respiratory failure requiring MV. Prednisone tapered over 2 weeks. Seen by Cardiology for atrial fibrillation -had hemoptysis with Coumadin. At discharge, he was in sinus rhythm and was not continued on anticoagulants.  On nectar thick liquids after dysphagia evaln   09/06/2012  He has been doing well. No increased dyspnea or edema. Tolerating meds well. No flare since last ov. Mucinex helps his congestion.  Taking nebs 3-4 times/d  Compliant with O2  Has maintained nSR -so no need for coumadin  >>cont advair and atrovent neb    10/11/2013  8 month follow up COPD  Pt returns for reports breathing is slight worse Remains active despite O2 dependent. Has a hobby for flying "drones" . Stayed indoors this winter No flare in cough or wheezing.  No fever, wt loss , chest pain or edema.   Chief Complaint  Patient presents with  . Follow-up    Breathing is unchanged. Extreme SOB with activity. Denies CP and wheezing.    Satn low on 2L pulse, stayed better with 2L cont Tried stopping valium- had sideeffects & restarted   Review of Systems neg for any significant sore throat, dysphagia, itching, sneezing, nasal congestion or excess/ purulent secretions, fever, chills, sweats, unintended wt loss, pleuritic or exertional cp,  hempoptysis, orthopnea pnd or change in chronic leg swelling. Also denies presyncope, palpitations, heartburn, abdominal pain, nausea, vomiting, diarrhea or change in bowel or urinary habits, dysuria,hematuria, rash, arthralgias, visual complaints, headache, numbness weakness or ataxia.     Objective:   Physical Exam  Gen. Pleasant, thin man, in no distress, on [place] O2 ENT - no lesions, no post nasal drip Neck: No JVD, no thyromegaly, no carotid bruits Lungs: no use of accessory muscles, no dullness to percussion, clear without rales or rhonchi  Cardiovascular: Rhythm regular, heart sounds  normal, no murmurs or gallops, no peripheral edema Musculoskeletal: No deformities, no cyanosis or clubbing         Assessment & Plan:

## 2013-10-14 ENCOUNTER — Telehealth: Payer: Self-pay | Admitting: Pulmonary Disease

## 2013-10-14 MED ORDER — BUDESONIDE-FORMOTEROL FUMARATE 160-4.5 MCG/ACT IN AERO: 2.0000 | INHALATION_SPRAY | Freq: Two times a day (BID) | RESPIRATORY_TRACT | Status: AC

## 2013-10-14 NOTE — Telephone Encounter (Signed)
Pt [facility]. He would rather have this sent to [facility] due to cost. This prescription has been sent to [facility]. Nothing further is needed.

## 2014-03-21 ENCOUNTER — Emergency Department (HOSPITAL_COMMUNITY): Payer: Medicare Other

## 2014-03-21 ENCOUNTER — Encounter (HOSPITAL_COMMUNITY): Payer: Self-pay | Admitting: Emergency Medicine

## 2014-03-21 ENCOUNTER — Emergency Department (HOSPITAL_COMMUNITY)
Admission: EM | Admit: 2014-03-21 | Discharge: 2014-03-21 | Disposition: A | Discharge status: Home / Self Care | Payer: Medicare Other | Attending: Emergency Medicine | Admitting: Emergency Medicine

## 2014-03-21 DIAGNOSIS — Z79899 Other long term (current) drug therapy: Secondary | ICD-10-CM | POA: Insufficient documentation

## 2014-03-21 DIAGNOSIS — Z9861 Coronary angioplasty status: Secondary | ICD-10-CM | POA: Diagnosis not present

## 2014-03-21 DIAGNOSIS — Z87448 Personal history of other diseases of urinary system: Secondary | ICD-10-CM | POA: Insufficient documentation

## 2014-03-21 DIAGNOSIS — K219 Gastro-esophageal reflux disease without esophagitis: Secondary | ICD-10-CM | POA: Diagnosis not present

## 2014-03-21 DIAGNOSIS — I10 Essential (primary) hypertension: Secondary | ICD-10-CM | POA: Diagnosis not present

## 2014-03-21 DIAGNOSIS — I509 Heart failure, unspecified: Secondary | ICD-10-CM | POA: Insufficient documentation

## 2014-03-21 DIAGNOSIS — Z9981 Dependence on supplemental oxygen: Secondary | ICD-10-CM | POA: Diagnosis not present

## 2014-03-21 DIAGNOSIS — E119 Type 2 diabetes mellitus without complications: Secondary | ICD-10-CM | POA: Diagnosis not present

## 2014-03-21 DIAGNOSIS — I2510 Atherosclerotic heart disease of native coronary artery without angina pectoris: Secondary | ICD-10-CM | POA: Diagnosis not present

## 2014-03-21 DIAGNOSIS — Z791 Long term (current) use of non-steroidal anti-inflammatories (NSAID): Secondary | ICD-10-CM | POA: Diagnosis not present

## 2014-03-21 DIAGNOSIS — J449 Chronic obstructive pulmonary disease, unspecified: Secondary | ICD-10-CM

## 2014-03-21 DIAGNOSIS — IMO0002 Reserved for concepts with insufficient information to code with codable children: Secondary | ICD-10-CM | POA: Insufficient documentation

## 2014-03-21 DIAGNOSIS — Z7982 Long term (current) use of aspirin: Secondary | ICD-10-CM | POA: Diagnosis not present

## 2014-03-21 DIAGNOSIS — J441 Chronic obstructive pulmonary disease with (acute) exacerbation: Secondary | ICD-10-CM | POA: Insufficient documentation

## 2014-03-21 DIAGNOSIS — Z951 Presence of aortocoronary bypass graft: Secondary | ICD-10-CM | POA: Insufficient documentation

## 2014-03-21 DIAGNOSIS — Z87891 Personal history of nicotine dependence: Secondary | ICD-10-CM | POA: Diagnosis not present

## 2014-03-21 DIAGNOSIS — R0602 Shortness of breath: Secondary | ICD-10-CM | POA: Insufficient documentation

## 2014-03-21 LAB — CBC WITH DIFFERENTIAL/PLATELET
Basophils Absolute: 0 10*3/uL (ref 0.0–0.1)
Basophils Relative: 0 % (ref 0–1)
EOS ABS: 0 10*3/uL (ref 0.0–0.7)
Eosinophils Relative: 0 % (ref 0–5)
HEMATOCRIT: 41.6 % (ref 39.0–52.0)
Hemoglobin: 13.7 g/dL (ref 13.0–17.0)
Lymphocytes Relative: 4 % — ABNORMAL LOW (ref 12–46)
Lymphs Abs: 0.3 10*3/uL — ABNORMAL LOW (ref 0.7–4.0)
MCH: 30.2 pg (ref 26.0–34.0)
MCHC: 32.9 g/dL (ref 30.0–36.0)
MCV: 91.6 fL (ref 78.0–100.0)
MONO ABS: 0.6 10*3/uL (ref 0.1–1.0)
MONOS PCT: 7 % (ref 3–12)
NEUTROS ABS: 7.2 10*3/uL (ref 1.7–7.7)
Neutrophils Relative %: 89 % — ABNORMAL HIGH (ref 43–77)
Platelets: 209 10*3/uL (ref 150–400)
RBC: 4.54 MIL/uL (ref 4.22–5.81)
RDW: 12.5 % (ref 11.5–15.5)
WBC: 8.1 10*3/uL (ref 4.0–10.5)

## 2014-03-21 LAB — COMPREHENSIVE METABOLIC PANEL
ALBUMIN: 4 g/dL (ref 3.5–5.2)
ALT: 14 U/L (ref 0–53)
ANION GAP: 12 (ref 5–15)
AST: 24 U/L (ref 0–37)
Alkaline Phosphatase: 61 U/L (ref 39–117)
BILIRUBIN TOTAL: 0.9 mg/dL (ref 0.3–1.2)
BUN: 22 mg/dL (ref 6–23)
CALCIUM: 9.3 mg/dL (ref 8.4–10.5)
CHLORIDE: 99 meq/L (ref 96–112)
CO2: 32 mEq/L (ref 19–32)
Creatinine, Ser: 1.13 mg/dL (ref 0.50–1.35)
GFR calc Af Amer: 75 mL/min — ABNORMAL LOW (ref >90)
GFR calc non Af Amer: 64 mL/min — ABNORMAL LOW (ref >90)
Glucose, Bld: 132 mg/dL — ABNORMAL HIGH (ref 70–99)
Potassium: 3.8 mEq/L (ref 3.7–5.3)
Sodium: 143 mEq/L (ref 137–147)
Total Protein: 7.1 g/dL (ref 6.0–8.3)

## 2014-03-21 LAB — TROPONIN I: Troponin I: <0.3 ng/mL (ref <0.30)

## 2014-03-21 MED ORDER — FUROSEMIDE 20 MG PO TABS: 20.0000 mg | ORAL_TABLET | Freq: Every day | ORAL | Status: AC

## 2014-03-21 MED ORDER — POTASSIUM CHLORIDE ER 10 MEQ PO TBCR: 10.0000 meq | EXTENDED_RELEASE_TABLET | Freq: Every day | ORAL | Status: AC

## 2014-03-21 MED ORDER — FUROSEMIDE 10 MG/ML IJ SOLN
20.0000 mg | Freq: Once | INTRAMUSCULAR | Status: AC
  Administered 2014-03-21: 20 mg via INTRAVENOUS
  Filled 2014-03-21: qty 2

## 2014-03-21 NOTE — Discharge Instructions (Signed)
You have a small amount of fluid in the lungs. Restart your fluid pill called Lasix.   Prescriptions for Lasix and potassium given. Followup your primary care Dr.

## 2014-03-21 NOTE — ED Provider Notes (Signed)
CSN: [id number]     Arrival date & time 03/21/14  1358 History  This chart was scribed for David HutchingBrian Anvi Mangal, MD by Leone PayorSonum Patel, ED Scribe. This patient was seen in room APA05/APA05 and the patient's care was started 2:22 PM.    Chief Complaint  Patient presents with  . Shortness of Breath    The history is provided by the patient. No language interpreter was used.    HPI Comments: Urbano HeirWayne E Velazquez is a 68 y.o. male with past medical history of HTN, CAD, CVA who presents to the Emergency Department complaining of 1-2 weeks of constant, gradually worsened SOB and bilateral lower leg swelling. Patient states he uses 2.5 L of oxygen at home and 4 breathing treatments daily. Patient states he can walk 25 feet before he becomes short of breath. He states he does not take diuretic medications currently, but he used to take Lasix 20 mg daily  Past Medical History  Diagnosis Date  . Hypertension   . On home oxygen therapy   . Coronary artery disease     s/p CABG 1996  . Diabetes mellitus   . CVA (cerebral infarction)     right sided requiring TPA in 2003  . BPH (benign prostatic hypertrophy)   . GERD (gastroesophageal reflux disease)    Past Surgical History  Procedure Laterality Date  . Coronary artery bypass graft  1996  . Coronary angioplasty  2002    Decreased LV function and 2 vessel cad -Rpt cath 1/10 nml LVSF, patent grafts  . Arthrodesis  2001    cervical disc disease - Dr Danielle DessElsner  . Sternal wires removal  2003   Family History  Problem Relation Age of Onset  . Heart failure Father    History  Substance Use Topics  . Smoking status: Former Smoker -- 2.00 packs/day for 40 years    Types: Cigarettes    Quit date: 07/12/2007  . Smokeless tobacco: Never Used  . Alcohol Use: No    Review of Systems  A complete 10 system review of systems was obtained and all systems are negative except as noted in the HPI and PMH.    Allergies  Azithromycin; Hydrocodone; and Warfarin  sodium  Home Medications   Prior to Admission medications   Medication Sig Start Date End Date Taking? Authorizing Provider  albuterol (PROVENTIL) (2.5 MG/3ML) 0.083% nebulizer solution Take 2.5 mg by nebulization 4 (four) times daily.    Yes Historical Provider, MD  alendronate (FOSAMAX) 70 MG tablet Take 1 tablet by mouth once a week. Takes on Sundays. 07/15/12  Yes Historical Provider, MD  aspirin EC 81 MG tablet Take 81 mg by mouth daily.   Yes Historical Provider, MD  budesonide-formoterol (SYMBICORT) 160-4.5 MCG/ACT inhaler Inhale 2 puffs into the lungs 2 (two) times daily. 10/14/13  Yes Oretha Milchakesh Alva V, MD  diltiazem (CARDIZEM CD) 120 MG 24 hr capsule Take 120 mg by mouth daily.  10/13/10  Yes Historical Provider, MD  ipratropium (ATROVENT) 0.02 % nebulizer solution Take 0.5 mg by nebulization 4 (four) times daily.    Yes Historical Provider, MD  isosorbide mononitrate (IMDUR) 60 MG 24 hr tablet Take 120 mg by mouth daily.   Yes Historical Provider, MD  meloxicam (MOBIC) 15 MG tablet Take 15 mg by mouth daily.   Yes Historical Provider, MD  nitroGLYCERIN (NITROSTAT) 0.4 MG SL tablet Place 1 tablet (0.4 mg total) under the tongue every 5 (five) minutes as needed for chest pain. Take one  tablet under tongue at onset of chest pain: you may repeat every 5 minutes for up to 3 doses 03/20/11  Yes Gaylord Shih, MD  pantoprazole (PROTONIX) 40 MG tablet Take one by mouth twice daily    Yes Historical Provider, MD  simvastatin (ZOCOR) 40 MG tablet Take 40 mg by mouth at bedtime.   Yes Historical Provider, MD  furosemide (LASIX) 20 MG tablet Take 1 tablet (20 mg total) by mouth daily. 03/21/14   David Hutching, MD  potassium chloride (K-DUR) 10 MEQ tablet Take 1 tablet (10 mEq total) by mouth daily. 03/21/14   David Hutching, MD   BP 104/72  Pulse 90  Temp(Src) 98.2 F (36.8 C) (Oral)  Resp 18  Ht 5\' 7"  (1.702 m)  Wt 135 lb (61.236 kg)  BMI 21.14 kg/m2  SpO2 99% Physical Exam  Nursing note and vitals  reviewed. Constitutional: He is oriented to person, place, and time. He appears well-developed and well-nourished.  Slight dyspnea   HENT:  Head: Normocephalic and atraumatic.  Eyes: Conjunctivae and EOM are normal. Pupils are equal, round, and reactive to light.  Neck: Normal range of motion. Neck supple.  Cardiovascular: Normal rate, regular rhythm and normal heart sounds.   Pulmonary/Chest: Effort normal and breath sounds normal.  Not moving air well.   Abdominal: Soft. Bowel sounds are normal.  Musculoskeletal: Normal range of motion. He exhibits edema (2+ peripheral edema bilaterally).  Neurological: He is alert and oriented to person, place, and time.  Skin: Skin is warm and dry.  Psychiatric: He has a normal mood and affect. His behavior is normal.    ED Course  Procedures (including critical care time)  DIAGNOSTIC STUDIES: Oxygen Saturation is 100% on 2.5 L oxygen, normal by my interpretation.    COORDINATION OF CARE: 2:25 PM Will order CXR, EKG, and blood work. Discussed treatment plan with pt at bedside and pt agreed to plan.   Labs Review Labs Reviewed  CBC WITH DIFFERENTIAL - Abnormal; Notable for the following:    Neutrophils Relative % 89 (*)    Lymphocytes Relative 4 (*)    Lymphs Abs 0.3 (*)    All other components within normal limits  COMPREHENSIVE METABOLIC PANEL - Abnormal; Notable for the following:    Glucose, Bld 132 (*)    GFR calc non Af Amer 64 (*)    GFR calc Af Amer 75 (*)    All other components within normal limits  TROPONIN I    Imaging Review Dg Chest Port 1 View  03/21/2014   CLINICAL DATA:  Short of breath for 2 weeks. No chest pain. Ankle swelling.  EXAM: PORTABLE CHEST - 1 VIEW  COMPARISON:  03/07/2014.  FINDINGS: Emphysema is present. Median sternotomy, likely for CABG. CABG marker appears projected over the inferior sternal wire. The cardiopericardial silhouette appears within normal limits and is unchanged compared to prior. There is  blunting of both costophrenic angles which is probably chronic. Prominence of the interstitium is present at the lung bases, which may represent interstitial pulmonary edema. No airspace disease.  13 mm density is present over the row RIGHT lung base.This may represent nipple shadow. Repeat frontal view of the chest with nipple markers is recommended.  IMPRESSION: Emphysema with probable interstitial pulmonary edema. 13 mm nodular density at the RIGHT lung base may represent nipple shadow. Repeat frontal view with nipple markers recommended for further assessment.   Electronically Signed   By: Andreas Newport M.D.   On: 03/21/2014  15:48     EKG Interpretation None      Date: 03/21/2014  Rate: 93  Rhythm: normal sinus rhythm  QRS Axis: normal  Intervals: normal  ST/T Wave abnormalities: normal  Conduction Disutrbances: none  Narrative Interpretation: unremarkable    MDM   Final diagnoses:  Congestive heart failure, unspecified congestive heart failure chronicity, unspecified congestive heart failure type  Chronic obstructive pulmonary disease, unspecified COPD, unspecified chronic bronchitis type   Patient has severe COPD.  Chest x-ray shows possible interstitial pulmonary edema. He had been on Lasix 20 mg in the past, but this was discontinued. Will restart Lasix 20 mg and potassium 10 mEq daily. He wants to go home. I called him at home at [phone].   I instructed him to get a repeat chest x-ray in the next 2 weeks to reevaluate a possible nodular density in the right lung  I personally performed the services described in this documentation, which was scribed in my presence. The recorded information has been reviewed and is accurate.   David Hutching, MD 03/21/14 2152

## 2014-03-21 NOTE — ED Notes (Signed)
Patient verbalizes understanding of discharge instructions and prescriptions. Patient escorted out of department at this time with family.

## 2014-03-21 NOTE — ED Notes (Signed)
Increased Sob for  1-2 weeks, No chest pain , swelling of ankles. On home 02 at 2.5 l  Alert,

## 2014-03-31 ENCOUNTER — Emergency Department (HOSPITAL_COMMUNITY): Payer: Medicare Other

## 2014-03-31 ENCOUNTER — Encounter (HOSPITAL_COMMUNITY): Payer: Self-pay | Admitting: Emergency Medicine

## 2014-03-31 ENCOUNTER — Inpatient Hospital Stay (HOSPITAL_COMMUNITY)
Admission: EM | Admit: 2014-03-31 | Discharge: 2014-05-11 | DRG: 193 | Disposition: E | Payer: Medicare Other | Attending: Family Medicine | Admitting: Family Medicine

## 2014-03-31 DIAGNOSIS — Z9981 Dependence on supplemental oxygen: Secondary | ICD-10-CM

## 2014-03-31 DIAGNOSIS — R091 Pleurisy: Secondary | ICD-10-CM | POA: Diagnosis present

## 2014-03-31 DIAGNOSIS — J9610 Chronic respiratory failure, unspecified whether with hypoxia or hypercapnia: Secondary | ICD-10-CM

## 2014-03-31 DIAGNOSIS — Z8249 Family history of ischemic heart disease and other diseases of the circulatory system: Secondary | ICD-10-CM | POA: Diagnosis not present

## 2014-03-31 DIAGNOSIS — R339 Retention of urine, unspecified: Secondary | ICD-10-CM | POA: Diagnosis present

## 2014-03-31 DIAGNOSIS — N138 Other obstructive and reflux uropathy: Secondary | ICD-10-CM | POA: Diagnosis present

## 2014-03-31 DIAGNOSIS — K5900 Constipation, unspecified: Secondary | ICD-10-CM | POA: Diagnosis present

## 2014-03-31 DIAGNOSIS — R0781 Pleurodynia: Secondary | ICD-10-CM | POA: Diagnosis present

## 2014-03-31 DIAGNOSIS — R3911 Hesitancy of micturition: Secondary | ICD-10-CM

## 2014-03-31 DIAGNOSIS — F411 Generalized anxiety disorder: Secondary | ICD-10-CM | POA: Diagnosis present

## 2014-03-31 DIAGNOSIS — T380X5A Adverse effect of glucocorticoids and synthetic analogues, initial encounter: Secondary | ICD-10-CM | POA: Diagnosis not present

## 2014-03-31 DIAGNOSIS — J441 Chronic obstructive pulmonary disease with (acute) exacerbation: Secondary | ICD-10-CM | POA: Diagnosis present

## 2014-03-31 DIAGNOSIS — J9621 Acute and chronic respiratory failure with hypoxia: Secondary | ICD-10-CM

## 2014-03-31 DIAGNOSIS — J9620 Acute and chronic respiratory failure, unspecified whether with hypoxia or hypercapnia: Secondary | ICD-10-CM | POA: Diagnosis present

## 2014-03-31 DIAGNOSIS — J189 Pneumonia, unspecified organism: Secondary | ICD-10-CM | POA: Diagnosis present

## 2014-03-31 DIAGNOSIS — I498 Other specified cardiac arrhythmias: Secondary | ICD-10-CM | POA: Diagnosis present

## 2014-03-31 DIAGNOSIS — E8809 Other disorders of plasma-protein metabolism, not elsewhere classified: Secondary | ICD-10-CM | POA: Diagnosis present

## 2014-03-31 DIAGNOSIS — Z8673 Personal history of transient ischemic attack (TIA), and cerebral infarction without residual deficits: Secondary | ICD-10-CM | POA: Diagnosis not present

## 2014-03-31 DIAGNOSIS — N401 Enlarged prostate with lower urinary tract symptoms: Secondary | ICD-10-CM | POA: Diagnosis present

## 2014-03-31 DIAGNOSIS — K219 Gastro-esophageal reflux disease without esophagitis: Secondary | ICD-10-CM | POA: Diagnosis present

## 2014-03-31 DIAGNOSIS — I25810 Atherosclerosis of coronary artery bypass graft(s) without angina pectoris: Secondary | ICD-10-CM | POA: Diagnosis present

## 2014-03-31 DIAGNOSIS — R071 Chest pain on breathing: Secondary | ICD-10-CM

## 2014-03-31 DIAGNOSIS — Z66 Do not resuscitate: Secondary | ICD-10-CM | POA: Diagnosis present

## 2014-03-31 DIAGNOSIS — R7309 Other abnormal glucose: Secondary | ICD-10-CM | POA: Diagnosis not present

## 2014-03-31 DIAGNOSIS — I4891 Unspecified atrial fibrillation: Secondary | ICD-10-CM

## 2014-03-31 DIAGNOSIS — R0902 Hypoxemia: Secondary | ICD-10-CM

## 2014-03-31 DIAGNOSIS — T50905A Adverse effect of unspecified drugs, medicaments and biological substances, initial encounter: Secondary | ICD-10-CM

## 2014-03-31 DIAGNOSIS — I10 Essential (primary) hypertension: Secondary | ICD-10-CM | POA: Diagnosis present

## 2014-03-31 DIAGNOSIS — R0602 Shortness of breath: Secondary | ICD-10-CM | POA: Diagnosis not present

## 2014-03-31 DIAGNOSIS — Z87891 Personal history of nicotine dependence: Secondary | ICD-10-CM

## 2014-03-31 DIAGNOSIS — Z515 Encounter for palliative care: Secondary | ICD-10-CM | POA: Diagnosis not present

## 2014-03-31 DIAGNOSIS — J9611 Chronic respiratory failure with hypoxia: Secondary | ICD-10-CM | POA: Diagnosis present

## 2014-03-31 DIAGNOSIS — R739 Hyperglycemia, unspecified: Secondary | ICD-10-CM | POA: Diagnosis not present

## 2014-03-31 HISTORY — DX: Chronic obstructive pulmonary disease, unspecified: J44.9

## 2014-03-31 HISTORY — DX: Illness, unspecified: R69

## 2014-03-31 HISTORY — DX: Solitary pulmonary nodule: R91.1

## 2014-03-31 HISTORY — DX: Unspecified atrial fibrillation: I48.91

## 2014-03-31 HISTORY — DX: Atherosclerosis of coronary artery bypass graft(s) without angina pectoris: I25.810

## 2014-03-31 HISTORY — DX: Generalized anxiety disorder: F41.1

## 2014-03-31 LAB — URINALYSIS, ROUTINE W REFLEX MICROSCOPIC
BILIRUBIN URINE: NEGATIVE
Glucose, UA: NEGATIVE mg/dL
Ketones, ur: 15 mg/dL — AB
Leukocytes, UA: NEGATIVE
NITRITE: NEGATIVE
PH: 7 (ref 5.0–8.0)
Protein, ur: 30 mg/dL — AB
Specific Gravity, Urine: 1.015 (ref 1.005–1.030)
Urobilinogen, UA: 4 mg/dL — ABNORMAL HIGH (ref 0.0–1.0)

## 2014-03-31 LAB — BLOOD GAS, ARTERIAL
ACID-BASE EXCESS: 8.6 mmol/L — AB (ref 0.0–2.0)
BICARBONATE: 33.8 meq/L — AB (ref 20.0–24.0)
Drawn by: 25788
O2 Content: 3 L/min
O2 Saturation: 97.5 %
Patient temperature: 37
TCO2: 29.8 mmol/L (ref 0–100)
pCO2 arterial: 57.8 mmHg (ref 35.0–45.0)
pH, Arterial: 7.384 (ref 7.350–7.450)
pO2, Arterial: 101 mmHg — ABNORMAL HIGH (ref 80.0–100.0)

## 2014-03-31 LAB — TROPONIN I
Troponin I: <0.3 ng/mL (ref <0.30)
Troponin I: <0.3 ng/mL (ref <0.30)

## 2014-03-31 LAB — COMPREHENSIVE METABOLIC PANEL
ALBUMIN: 3.9 g/dL (ref 3.5–5.2)
ALK PHOS: 56 U/L (ref 39–117)
ALT: 23 U/L (ref 0–53)
AST: 33 U/L (ref 0–37)
Anion gap: 12 (ref 5–15)
BILIRUBIN TOTAL: 1.6 mg/dL — AB (ref 0.3–1.2)
BUN: 19 mg/dL (ref 6–23)
CHLORIDE: 95 meq/L — AB (ref 96–112)
CO2: 34 meq/L — AB (ref 19–32)
Calcium: 8.8 mg/dL (ref 8.4–10.5)
Creatinine, Ser: 1.05 mg/dL (ref 0.50–1.35)
GFR calc Af Amer: 82 mL/min — ABNORMAL LOW (ref >90)
GFR calc non Af Amer: 70 mL/min — ABNORMAL LOW (ref >90)
Glucose, Bld: 117 mg/dL — ABNORMAL HIGH (ref 70–99)
Potassium: 3.8 mEq/L (ref 3.7–5.3)
Sodium: 141 mEq/L (ref 137–147)
Total Protein: 7 g/dL (ref 6.0–8.3)

## 2014-03-31 LAB — CBC WITH DIFFERENTIAL/PLATELET
BASOS ABS: 0 10*3/uL (ref 0.0–0.1)
BASOS PCT: 0 % (ref 0–1)
Eosinophils Absolute: 0 10*3/uL (ref 0.0–0.7)
Eosinophils Relative: 0 % (ref 0–5)
HCT: 41.1 % (ref 39.0–52.0)
Hemoglobin: 13.5 g/dL (ref 13.0–17.0)
LYMPHS PCT: 4 % — AB (ref 12–46)
Lymphs Abs: 0.6 10*3/uL — ABNORMAL LOW (ref 0.7–4.0)
MCH: 29.9 pg (ref 26.0–34.0)
MCHC: 32.8 g/dL (ref 30.0–36.0)
MCV: 91.1 fL (ref 78.0–100.0)
Monocytes Absolute: 0.9 10*3/uL (ref 0.1–1.0)
Monocytes Relative: 5 % (ref 3–12)
NEUTROS ABS: 15.5 10*3/uL — AB (ref 1.7–7.7)
NEUTROS PCT: 91 % — AB (ref 43–77)
Platelets: 195 10*3/uL (ref 150–400)
RBC: 4.51 MIL/uL (ref 4.22–5.81)
RDW: 12.6 % (ref 11.5–15.5)
WBC: 17 10*3/uL — AB (ref 4.0–10.5)

## 2014-03-31 LAB — PRO B NATRIURETIC PEPTIDE: Pro B Natriuretic peptide (BNP): 1416 pg/mL — ABNORMAL HIGH (ref 0–125)

## 2014-03-31 LAB — GLUCOSE, CAPILLARY: Glucose-Capillary: 169 mg/dL — ABNORMAL HIGH (ref 70–99)

## 2014-03-31 LAB — URINE MICROSCOPIC-ADD ON

## 2014-03-31 MED ORDER — NITROGLYCERIN 0.4 MG SL SUBL
0.4000 mg | SUBLINGUAL_TABLET | SUBLINGUAL | Status: DC | PRN
  Filled 2014-03-31: qty 1

## 2014-03-31 MED ORDER — LEVALBUTEROL HCL 0.63 MG/3ML IN NEBU
INHALATION_SOLUTION | RESPIRATORY_TRACT | Status: AC
  Filled 2014-03-31: qty 3

## 2014-03-31 MED ORDER — PANTOPRAZOLE SODIUM 40 MG PO TBEC
40.0000 mg | DELAYED_RELEASE_TABLET | Freq: Every day | ORAL | Status: DC
  Administered 2014-03-31 – 2014-04-10 (×11): 40 mg via ORAL
  Filled 2014-03-31 (×12): qty 1

## 2014-03-31 MED ORDER — INSULIN ASPART 100 UNIT/ML ~~LOC~~ SOLN: 0.0000 [IU] | Freq: Every day | SUBCUTANEOUS | Status: DC

## 2014-03-31 MED ORDER — POTASSIUM CHLORIDE CRYS ER 10 MEQ PO TBCR
10.0000 meq | EXTENDED_RELEASE_TABLET | Freq: Every day | ORAL | Status: DC
  Administered 2014-03-31 – 2014-04-03 (×4): 10 meq via ORAL
  Filled 2014-03-31 (×4): qty 1

## 2014-03-31 MED ORDER — ALUM & MAG HYDROXIDE-SIMETH 200-200-20 MG/5ML PO SUSP
30.0000 mL | Freq: Four times a day (QID) | ORAL | Status: DC | PRN
  Administered 2014-04-06: 30 mL via ORAL
  Filled 2014-03-31 (×2): qty 30

## 2014-03-31 MED ORDER — DILTIAZEM HCL ER COATED BEADS 120 MG PO CP24
120.0000 mg | ORAL_CAPSULE | Freq: Every day | ORAL | Status: DC
  Administered 2014-03-31 – 2014-04-06 (×7): 120 mg via ORAL
  Filled 2014-03-31 (×7): qty 1

## 2014-03-31 MED ORDER — SIMVASTATIN 20 MG PO TABS
40.0000 mg | ORAL_TABLET | Freq: Every day | ORAL | Status: DC
  Administered 2014-03-31 – 2014-04-09 (×10): 40 mg via ORAL
  Filled 2014-03-31 (×9): qty 2

## 2014-03-31 MED ORDER — MELOXICAM 7.5 MG PO TABS
15.0000 mg | ORAL_TABLET | Freq: Every day | ORAL | Status: DC
  Administered 2014-03-31 – 2014-04-07 (×8): 15 mg via ORAL
  Filled 2014-03-31 (×11): qty 2

## 2014-03-31 MED ORDER — HEPARIN SODIUM (PORCINE) 5000 UNIT/ML IJ SOLN
5000.0000 [IU] | Freq: Three times a day (TID) | INTRAMUSCULAR | Status: DC
  Administered 2014-03-31 – 2014-04-10 (×30): 5000 [IU] via SUBCUTANEOUS
  Filled 2014-03-31 (×30): qty 1

## 2014-03-31 MED ORDER — ONDANSETRON HCL 4 MG PO TABS: 4.0000 mg | ORAL_TABLET | Freq: Four times a day (QID) | ORAL | Status: DC | PRN

## 2014-03-31 MED ORDER — ACETAMINOPHEN 650 MG RE SUPP: 650.0000 mg | Freq: Four times a day (QID) | RECTAL | Status: DC | PRN

## 2014-03-31 MED ORDER — DIAZEPAM 5 MG PO TABS
2.5000 mg | ORAL_TABLET | Freq: Four times a day (QID) | ORAL | Status: DC | PRN
  Administered 2014-03-31 – 2014-04-02 (×5): 2.5 mg via ORAL
  Filled 2014-03-31 (×5): qty 1

## 2014-03-31 MED ORDER — ASPIRIN EC 81 MG PO TBEC
81.0000 mg | DELAYED_RELEASE_TABLET | Freq: Every day | ORAL | Status: DC
  Administered 2014-04-01 – 2014-04-10 (×10): 81 mg via ORAL
  Filled 2014-03-31 (×11): qty 1

## 2014-03-31 MED ORDER — ONDANSETRON HCL 4 MG/2ML IJ SOLN: 4.0000 mg | Freq: Three times a day (TID) | INTRAMUSCULAR | Status: DC | PRN

## 2014-03-31 MED ORDER — TAMSULOSIN HCL 0.4 MG PO CAPS
0.4000 mg | ORAL_CAPSULE | Freq: Every day | ORAL | Status: DC
  Administered 2014-03-31 – 2014-04-09 (×10): 0.4 mg via ORAL
  Filled 2014-03-31 (×10): qty 1

## 2014-03-31 MED ORDER — ONDANSETRON HCL 4 MG/2ML IJ SOLN: 4.0000 mg | Freq: Four times a day (QID) | INTRAMUSCULAR | Status: DC | PRN

## 2014-03-31 MED ORDER — HYDROMORPHONE HCL PF 1 MG/ML IJ SOLN: 0.5000 mg | INTRAMUSCULAR | Status: DC | PRN

## 2014-03-31 MED ORDER — INSULIN ASPART 100 UNIT/ML ~~LOC~~ SOLN
0.0000 [IU] | Freq: Three times a day (TID) | SUBCUTANEOUS | Status: DC
  Administered 2014-03-31: 3 [IU] via SUBCUTANEOUS
  Administered 2014-04-01 – 2014-04-03 (×7): 2 [IU] via SUBCUTANEOUS
  Administered 2014-04-03: 3 [IU] via SUBCUTANEOUS
  Administered 2014-04-03 – 2014-04-04 (×2): 2 [IU] via SUBCUTANEOUS
  Administered 2014-04-04: 3 [IU] via SUBCUTANEOUS
  Administered 2014-04-05 (×2): 2 [IU] via SUBCUTANEOUS
  Administered 2014-04-06 (×2): 3 [IU] via SUBCUTANEOUS
  Administered 2014-04-07: 2 [IU] via SUBCUTANEOUS
  Administered 2014-04-07 – 2014-04-10 (×7): 3 [IU] via SUBCUTANEOUS

## 2014-03-31 MED ORDER — LEVALBUTEROL HCL 0.63 MG/3ML IN NEBU: 0.6300 mg | INHALATION_SOLUTION | RESPIRATORY_TRACT | Status: DC

## 2014-03-31 MED ORDER — ISOSORBIDE MONONITRATE ER 60 MG PO TB24
120.0000 mg | ORAL_TABLET | Freq: Every day | ORAL | Status: DC
  Administered 2014-03-31 – 2014-04-10 (×11): 120 mg via ORAL
  Filled 2014-03-31 (×12): qty 2

## 2014-03-31 MED ORDER — IPRATROPIUM BROMIDE 0.02 % IN SOLN
0.5000 mg | RESPIRATORY_TRACT | Status: DC
  Administered 2014-03-31 – 2014-04-04 (×23): 0.5 mg via RESPIRATORY_TRACT
  Filled 2014-03-31 (×22): qty 2.5

## 2014-03-31 MED ORDER — METHYLPREDNISOLONE SODIUM SUCC 125 MG IJ SOLR
80.0000 mg | Freq: Four times a day (QID) | INTRAMUSCULAR | Status: DC
  Administered 2014-03-31 – 2014-04-01 (×4): 80 mg via INTRAVENOUS
  Filled 2014-03-31 (×5): qty 2

## 2014-03-31 MED ORDER — DOCUSATE SODIUM 100 MG PO CAPS
100.0000 mg | ORAL_CAPSULE | Freq: Two times a day (BID) | ORAL | Status: DC
  Administered 2014-03-31 – 2014-04-04 (×9): 100 mg via ORAL
  Filled 2014-03-31 (×9): qty 1

## 2014-03-31 MED ORDER — POTASSIUM CHLORIDE IN NACL 20-0.9 MEQ/L-% IV SOLN
INTRAVENOUS | Status: DC
  Administered 2014-03-31 – 2014-04-03 (×3): via INTRAVENOUS

## 2014-03-31 MED ORDER — LEVALBUTEROL HCL 0.63 MG/3ML IN NEBU
0.6300 mg | INHALATION_SOLUTION | RESPIRATORY_TRACT | Status: DC
  Administered 2014-03-31: 0.63 mg via RESPIRATORY_TRACT

## 2014-03-31 MED ORDER — LEVOFLOXACIN IN D5W 500 MG/100ML IV SOLN
500.0000 mg | Freq: Once | INTRAVENOUS | Status: AC
  Administered 2014-03-31: 500 mg via INTRAVENOUS
  Filled 2014-03-31: qty 100

## 2014-03-31 MED ORDER — DIAZEPAM 5 MG PO TABS
2.5000 mg | ORAL_TABLET | ORAL | Status: AC
Start: 2014-03-31 — End: 2014-03-31
  Administered 2014-03-31: 2.5 mg via ORAL
  Filled 2014-03-31: qty 1

## 2014-03-31 MED ORDER — LEVOFLOXACIN IN D5W 750 MG/150ML IV SOLN: 750.0000 mg | INTRAVENOUS | Status: DC

## 2014-03-31 MED ORDER — ACETAMINOPHEN 325 MG PO TABS: 650.0000 mg | ORAL_TABLET | Freq: Four times a day (QID) | ORAL | Status: DC | PRN

## 2014-03-31 MED ORDER — DIAZEPAM 5 MG PO TABS: 2.5000 mg | ORAL_TABLET | Freq: Four times a day (QID) | ORAL | Status: DC | PRN

## 2014-03-31 MED ORDER — IPRATROPIUM-ALBUTEROL 0.5-2.5 (3) MG/3ML IN SOLN
3.0000 mL | Freq: Once | RESPIRATORY_TRACT | Status: AC
  Administered 2014-03-31: 3 mL via RESPIRATORY_TRACT
  Filled 2014-03-31: qty 3

## 2014-03-31 MED ORDER — LEVOFLOXACIN IN D5W 750 MG/150ML IV SOLN: 750.0000 mg | Freq: Once | INTRAVENOUS | Status: DC

## 2014-03-31 MED ORDER — ALBUTEROL SULFATE (2.5 MG/3ML) 0.083% IN NEBU
2.5000 mg | INHALATION_SOLUTION | Freq: Once | RESPIRATORY_TRACT | Status: AC
  Administered 2014-03-31: 2.5 mg via RESPIRATORY_TRACT
  Filled 2014-03-31: qty 3

## 2014-03-31 MED ORDER — OXYCODONE HCL 5 MG PO TABS
5.0000 mg | ORAL_TABLET | ORAL | Status: DC | PRN
  Administered 2014-04-02 – 2014-04-11 (×4): 5 mg via ORAL
  Filled 2014-03-31 (×5): qty 1

## 2014-03-31 MED ORDER — LEVALBUTEROL HCL 0.63 MG/3ML IN NEBU
0.6300 mg | INHALATION_SOLUTION | RESPIRATORY_TRACT | Status: DC
  Administered 2014-03-31 – 2014-04-04 (×22): 0.63 mg via RESPIRATORY_TRACT
  Filled 2014-03-31 (×22): qty 3

## 2014-03-31 MED ORDER — IPRATROPIUM BROMIDE 0.02 % IN SOLN
RESPIRATORY_TRACT | Status: AC
  Filled 2014-03-31: qty 2.5

## 2014-03-31 MED ORDER — GUAIFENESIN ER 600 MG PO TB12
600.0000 mg | ORAL_TABLET | Freq: Two times a day (BID) | ORAL | Status: DC
  Administered 2014-03-31 – 2014-04-10 (×21): 600 mg via ORAL
  Filled 2014-03-31 (×22): qty 1

## 2014-03-31 MED ORDER — BUDESONIDE-FORMOTEROL FUMARATE 160-4.5 MCG/ACT IN AERO
2.0000 | INHALATION_SPRAY | Freq: Two times a day (BID) | RESPIRATORY_TRACT | Status: DC
  Administered 2014-03-31 – 2014-04-06 (×12): 2 via RESPIRATORY_TRACT
  Filled 2014-03-31: qty 6

## 2014-03-31 NOTE — ED Notes (Signed)
[facility], all questions answered.

## 2014-03-31 NOTE — ED Provider Notes (Addendum)
CSN: [id number]     Arrival date & time 26-Jun-2014  [id number] History  This chart was scribed for David LennertJoseph L Izaan Kingbird, MD by Karle PlumberJennifer Tensley, ED Scribe. This patient was seen in room APA19/APA19 and the patient's care was started at 9:02 AM.  Chief Complaint  Patient presents with  . Chest Pain   Patient is a 68 y.o. male presenting with chest pain. The history is provided by the patient. No language interpreter was used.  Chest Pain Pain location:  Unable to specify Pain quality: dull   Pain severity:  Moderate Duration:  1 week Chronicity:  New Context: at rest   Relieved by:  None tried Worsened by:  Nothing tried Associated symptoms: cough, diaphoresis, shortness of breath and weakness   Associated symptoms: no abdominal pain, no back pain, no fatigue and no headache   Cough:    Cough characteristics:  Productive   Sputum characteristics:  Nondescript   Severity:  Moderate   Onset quality:  Unable to specify   Timing:  Intermittent   Chronicity:  Recurrent Shortness of breath:    Severity:  Moderate   Progression:  Worsening Risk factors: coronary artery disease and hypertension    HPI Comments:  [name] is a 68 y.o. male with PMH of HTN, CAD and CVA, brought in by EMS, who presents to the Emergency Department complaining of a dull chest pain onset approximately one week. He reports associated generalized weakness he states started after starting Lasix while here last week and associated SOB. Pt reports intermittent productive cough. He states he has been experiencing cramping in his legs and diaphoresis. He states his prostate is enlarged and has been having a hard time urinating. Pt lives alone.   Past Medical History  Diagnosis Date  . Hypertension   . On home oxygen therapy   . Coronary artery disease     s/p CABG 1996  . Diabetes mellitus   . CVA (cerebral infarction)     right sided requiring TPA in 2003  . BPH (benign prostatic hypertrophy)   . GERD (gastroesophageal  reflux disease)    Past Surgical History  Procedure Laterality Date  . Coronary artery bypass graft  1996  . Coronary angioplasty  2002    Decreased LV function and 2 vessel cad -Rpt cath 1/10 nml LVSF, patent grafts  . Arthrodesis  2001    cervical disc disease - Dr Danielle DessElsner  . Sternal wires removal  2003   Family History  Problem Relation Age of Onset  . Heart failure Father    History  Substance Use Topics  . Smoking status: Former Smoker -- 2.00 packs/day for 40 years    Types: Cigarettes    Quit date: 07/12/2007  . Smokeless tobacco: Never Used  . Alcohol Use: No    Review of Systems  Constitutional: Positive for diaphoresis. Negative for appetite change and fatigue.  HENT: Negative for congestion, ear discharge and sinus pressure.   Eyes: Negative for discharge.  Respiratory: Positive for cough, chest tightness and shortness of breath.   Cardiovascular: Positive for chest pain.  Gastrointestinal: Negative for abdominal pain and diarrhea.  Genitourinary: Positive for difficulty urinating (normal at baseline secondary to enlarged prostate). Negative for frequency and hematuria.  Musculoskeletal: Negative for back pain.  Skin: Negative for rash.  Neurological: Positive for weakness. Negative for seizures and headaches.  Psychiatric/Behavioral: Negative for hallucinations.    Allergies  Azithromycin; Hydrocodone; and Warfarin sodium  Home Medications   Prior  to Admission medications   Medication Sig Start Date End Date Taking? Authorizing Provider  albuterol (PROVENTIL) (2.5 MG/3ML) 0.083% nebulizer solution Take 2.5 mg by nebulization 4 (four) times daily.    Yes Historical Provider, MD  alendronate (FOSAMAX) 70 MG tablet Take 1 tablet by mouth once a week. Takes on Sundays. 07/15/12  Yes Historical Provider, MD  aspirin EC 81 MG tablet Take 81 mg by mouth daily.   Yes Historical Provider, MD  budesonide-formoterol (SYMBICORT) 160-4.5 MCG/ACT inhaler Inhale 2 puffs  into the lungs 2 (two) times daily. 10/14/13  Yes Oretha Milch, MD  diltiazem (CARDIZEM CD) 120 MG 24 hr capsule Take 120 mg by mouth daily.  10/13/10  Yes Historical Provider, MD  furosemide (LASIX) 20 MG tablet Take 1 tablet (20 mg total) by mouth daily. 03/21/14  Yes Donnetta Hutching, MD  ipratropium (ATROVENT) 0.02 % nebulizer solution Take 0.5 mg by nebulization 4 (four) times daily.    Yes Historical Provider, MD  isosorbide mononitrate (IMDUR) 60 MG 24 hr tablet Take 120 mg by mouth daily.   Yes Historical Provider, MD  meloxicam (MOBIC) 15 MG tablet Take 15 mg by mouth daily.   Yes Historical Provider, MD  nitroGLYCERIN (NITROSTAT) 0.4 MG SL tablet Place 1 tablet (0.4 mg total) under the tongue every 5 (five) minutes as needed for chest pain. Take one tablet under tongue at onset of chest pain: you may repeat every 5 minutes for up to 3 doses 03/20/11  Yes Gaylord Shih, MD  pantoprazole (PROTONIX) 40 MG tablet Take one by mouth twice daily    Yes Historical Provider, MD  potassium chloride (K-DUR) 10 MEQ tablet Take 1 tablet (10 mEq total) by mouth daily. 03/21/14  Yes Donnetta Hutching, MD  simvastatin (ZOCOR) 40 MG tablet Take 40 mg by mouth at bedtime.   Yes Historical Provider, MD   Triage Vitals: BP 142/89  Pulse 117  Temp(Src) 98.2 F (36.8 C) (Oral)  Resp 22  Ht 5\' 7"  (1.702 m)  Wt 110 lb (49.896 kg)  BMI 17.22 kg/m2  SpO2 99% Physical Exam  Constitutional: He is oriented to person, place, and time. He appears well-developed.  Cachectic.  HENT:  Head: Normocephalic.  Mouth/Throat: Mucous membranes are dry.  Eyes: Conjunctivae and EOM are normal. No scleral icterus.  Neck: Neck supple. No thyromegaly present.  Cardiovascular: Exam reveals no gallop and no friction rub.   No murmur heard. tacycardia  Pulmonary/Chest: No stridor. He has wheezes. He has no rales. He exhibits no tenderness.  Mild wheezing bilaterally.  Abdominal: He exhibits distension. There is tenderness. There is no  rebound.  Distended, minimally tender abdomen.  Musculoskeletal: Normal range of motion. He exhibits no edema.  Lymphadenopathy:    He has no cervical adenopathy.  Neurological: He is oriented to person, place, and time. He exhibits normal muscle tone. Coordination normal.  Skin: No rash noted. No erythema.  Psychiatric: He has a normal mood and affect. His behavior is normal.    ED Course  Procedures (including critical care time) DIAGNOSTIC STUDIES: Oxygen Saturation is 99% on 3 L/[place], normal by my interpretation.   COORDINATION OF CARE: 9:10 AM- Will order CXR, nebulizer treatment and labs. Pt verbalizes understanding and agrees to plan.  Medications  levofloxacin (LEVAQUIN) IVPB 500 mg (not administered)  ipratropium-albuterol (DUONEB) 0.5-2.5 (3) MG/3ML nebulizer solution 3 mL (3 mLs Nebulization Given 07-Apr-2014 0936)  albuterol (PROVENTIL) (2.5 MG/3ML) 0.083% nebulizer solution 2.5 mg (2.5 mg Nebulization Given Apr 07, 2014  1610)    Labs Review Labs Reviewed  CBC WITH DIFFERENTIAL - Abnormal; Notable for the following:    WBC 17.0 (*)    Neutrophils Relative % 91 (*)    Neutro Abs 15.5 (*)    Lymphocytes Relative 4 (*)    Lymphs Abs 0.6 (*)    All other components within normal limits  COMPREHENSIVE METABOLIC PANEL - Abnormal; Notable for the following:    Chloride 95 (*)    CO2 34 (*)    Glucose, Bld 117 (*)    Total Bilirubin 1.6 (*)    GFR calc non Af Amer 70 (*)    GFR calc Af Amer 82 (*)    All other components within normal limits  URINALYSIS, ROUTINE W REFLEX MICROSCOPIC - Abnormal; Notable for the following:    Hgb urine dipstick SMALL (*)    Ketones, ur 15 (*)    Protein, ur 30 (*)    Urobilinogen, UA 4.0 (*)    All other components within normal limits  TROPONIN I  URINE MICROSCOPIC-ADD ON    Imaging Review Dg Chest Portable 1 View  03/11/2014   CLINICAL DATA:  Chest pain  EXAM: PORTABLE CHEST - 1 VIEW  COMPARISON:  03/21/2014  FINDINGS: Severe hyper  aeration. Postoperative changes at the left apex. Heterogeneous opacities at the right apex. Lungs are otherwise stable in appearance. No pneumothorax or pleural effusion. Normal heart size. Nipple shadow at the right base.  IMPRESSION: Right upper lobe heterogeneous opacities worrisome for an inflammatory process. Followup until resolved is recommended   Electronically Signed   By: Maryclare Bean M.D.   On: 03/30/2014 09:30     EKG Interpretation   Date/Time:  Friday March 31 2014 08:53:00 EDT Ventricular Rate:  112 PR Interval:  98 QRS Duration: 97 QT Interval:  344 QTC Calculation: 469 R Axis:   69 Text Interpretation:  Sinus tachycardia Right atrial enlargement RSR' in  V1 or V2, probably normal variant Probable LVH with secondary repol abnrm  Confirmed by Socorro Kanitz  MD, Hooper P[place]way [phone]) on 04/07/2014 10:56:06 AM      MDM   Final diagnoses:  None      I personally performed the services described in this documentation, which was scribed in my presence. The recorded information has been reviewed and is accurate.    David Lennert, MD 03/17/2014 1056  David Lennert, MD 04/03/14 1700

## 2014-03-31 NOTE — ED Notes (Addendum)
Pt states he has had dull chest pain for over 1 week since his last ER visit. Pt states he has had chest pain and weakness since starting Lasix after last visit. Denies nausea N/V/D, weakness is generalized, on triage equal grip strength. Pt has congested cough. Breath sounds coarse. Pt is on 3L O2 [place] chronic. Per EMS CBG 120, pt denies DMII and states his glucose only goes up when he gets steroids but has never been diagnosed by a doctor as having diabetes.

## 2014-03-31 NOTE — H&P (Signed)
[facility] History and Physical  David HeirWayne E Brackney WUJ:[id number] DOB: 1945/08/19 DOA: 03/14/2014  Referring physician: ED physician, Dr. Estell HarpinZammit PCP: Colette RibasGOLDING, JOHN CABOT, MD  Pulmonologist: Dr. Vassie LollAlva  Chief Complaint: Chest pain or shortness of breath.  HPI: [name] is a 68 y.o. male  with a history of coronary artery disease, status post CABG, oxygen-dependent COPD, steroid-induced hyperglycemia (the patient denies history of diabetes) and chronic anxiety, who presents to the emergency department today with a chief complaint of chest pain and shortness of breath. Both symptoms started approximately one week ago, but have become progressively worse over the last 2-3 days. His pain is located centrally and to the left and is associated with coughing, wheezing, and pleurisy. He denies the pain as a tightness. He denies associated radiation, nausea, or diaphoresis. He has shortness of breath chronically, but he has been more short of breath over the past 2-3 days, particularly with any activity. He denies outright orthopnea. He has had more wheezing. He has had a cough with "yucky" colored sputum. He has been using his albuterol nebulizers 4-5 times a day with no improvement in his symptoms. Last week he had lower extremity swelling of both legs and presented to the ED. He was treated and discharged on Lasix on 03/21/14. He reports no further bilateral leg swelling. He denies associated fever or chills. He also reports some difficulty urinating and a Foley catheter was inserted in the ED. He has had urinary hesitancy for some time now, but has been able to urinate some.  In the ED, he was tachycardic and oxygenating 99% on 3 L of nasal oxygen. His blood pressure was within normal limits. He was afebrile. His urinalysis revealed no evidence of leukocytes or WBCs, but was positive for 15 ketones. His EKG reveals sinus tachycardia with a heart rate of 112 beats per minute. His chest x-ray revealed  right upper lobe heterogeneous opacities, worrisome for an inflammatory process. His lab data were significant for a negative troponin I. and a WBC of 17.0. Later following admission, his ABG on 3 L of oxygen revealed a pH of 7.38, PCO2 of 58, and PO2 of 101. He is being admitted for further evaluation and management.   Review of Systems:  As above in history present illness. In addition, he has had some anxiousness as he has weaned himself off of Valium; he also has chronic shortness of breath; he had recent swelling in both of his legs, now resolved; he has had some difficulty urinating; he denies nausea, vomiting, diarrhea; otherwise review of systems is negative.  Past Medical History  Diagnosis Date  . Hypertension   . On home oxygen therapy   . Coronary artery disease     s/p CABG 1996  . Diabetes mellitus   . CVA (cerebral infarction)     right sided requiring TPA in 2003  . BPH (benign prostatic hypertrophy)   . GERD (gastroesophageal reflux disease)   . CHRONIC OBSTRUCTIVE PULMONARY DISEASE 08/17/2007    PFTs 1/09 >> FEV1 0.64 (21%), ratio 22, DLCO 35%    . HYPOXEMIA HYPOXIA HYPOXIC 08/17/2007    Qualifier: Diagnosis of  By: Vassie LollAlva MD, Comer Locketakesh V.    . Nodule of left lung 11/04/2010    Serial CT scans from '08 show inflammatory scarring     . Atrial fibrillation 07/16/2010    Qualifier: Diagnosis of  By: Daleen SquibbWall, MD, Lorenza CambridgeFACC, Thomas C   . Coronary atherosclerosis of artery bypass graft 08/20/2009  Qualifier: Diagnosis of  By: Via LPN, [name]    . Generalized anxiety disorder    Past Surgical History  Procedure Laterality Date  . Coronary artery bypass graft  1996  . Coronary angioplasty  2002    Decreased LV function and 2 vessel cad -Rpt cath 1/10 nml LVSF, patent grafts  . Arthrodesis  2001    cervical disc disease - Dr Danielle Dess  . Sternal wires removal  2003   Social History: The patient is divorced. He lives alone. He has one son. He still drives. He is retired. He reports that he  quit smoking about 6 years ago. His smoking use included Cigarettes. He has a 80 pack-year smoking history. He has never used smokeless tobacco. He reports that he does not drink alcohol or use illicit drugs.  Allergies  Allergen Reactions  . Azithromycin     REACTION: sweats, vomiting  . Hydrocodone     REACTION: GI upset  . Warfarin Sodium     Family History  Problem Relation Age of Onset  . Heart failure Father      Prior to Admission medications   Medication Sig Start Date End Date Taking? Authorizing Provider  albuterol (PROVENTIL) (2.5 MG/3ML) 0.083% nebulizer solution Take 2.5 mg by nebulization 4 (four) times daily.    Yes Historical Provider, MD  alendronate (FOSAMAX) 70 MG tablet Take 1 tablet by mouth once a week. Takes on Sundays. 07/15/12  Yes Historical Provider, MD  aspirin EC 81 MG tablet Take 81 mg by mouth daily.   Yes Historical Provider, MD  budesonide-formoterol (SYMBICORT) 160-4.5 MCG/ACT inhaler Inhale 2 puffs into the lungs 2 (two) times daily. 10/14/13  Yes Oretha Milch, MD  diltiazem (CARDIZEM CD) 120 MG 24 hr capsule Take 120 mg by mouth daily.  10/13/10  Yes Historical Provider, MD  furosemide (LASIX) 20 MG tablet Take 1 tablet (20 mg total) by mouth daily. 03/21/14  Yes Donnetta Hutching, MD  ipratropium (ATROVENT) 0.02 % nebulizer solution Take 0.5 mg by nebulization 4 (four) times daily.    Yes Historical Provider, MD  isosorbide mononitrate (IMDUR) 60 MG 24 hr tablet Take 120 mg by mouth daily.   Yes Historical Provider, MD  meloxicam (MOBIC) 15 MG tablet Take 15 mg by mouth daily.   Yes Historical Provider, MD  nitroGLYCERIN (NITROSTAT) 0.4 MG SL tablet Place 1 tablet (0.4 mg total) under the tongue every 5 (five) minutes as needed for chest pain. Take one tablet under tongue at onset of chest pain: you may repeat every 5 minutes for up to 3 doses 03/20/11  Yes Gaylord Shih, MD  pantoprazole (PROTONIX) 40 MG tablet Take one by mouth twice daily    Yes Historical  Provider, MD  potassium chloride (K-DUR) 10 MEQ tablet Take 1 tablet (10 mEq total) by mouth daily. 03/21/14  Yes Donnetta Hutching, MD  simvastatin (ZOCOR) 40 MG tablet Take 40 mg by mouth at bedtime.   Yes Historical Provider, MD   Physical Exam: Filed Vitals:   04/05/2014 1042 04/06/2014 1122 03/11/2014 1159 03/12/2014 1416  BP: 113/85 127/87 122/87   Pulse: 111 112 113   Temp:   98.9 F (37.2 C)   TempSrc:   Oral   Resp: 18 22    Height:   5\' 7"  (1.702 m)   Weight:      SpO2: 95% 95% 99% 97%    Wt Readings from Last 3 Encounters:  03/22/2014 49.896 kg (110 lb)  03/21/14 61.236  kg (135 lb)  10/11/13 61.689 kg (136 lb)    General:  Small framed 68 year old Caucasian man who is sitting up in bed, with dyspnea. Eyes: PERRL, normal lids, irises & conjunctiva ENT: Oropharynx reveals no teeth; mucous membranes are dry; no posterior exudates or erythema. Neck: no LAD, masses or thyromegaly Cardiovascular: S1, S2, with tachycardia; no ectopy. No LE edema. Telemetry: Sinus tachycardia, no arrhythmias  Respiratory: Barrel chested; globally decreased breath sounds throughout all lung fields, but occasional scattered upper airway wheezes and crackles auscultated. Breathing mildly labored. Abdomen: Positive bowel sounds soft, ntnd Skin: no rash or induration seen on limited exam Musculoskeletal: grossly normal tone BUE/BLE Psychiatric: grossly normal mood and affect, speech fluent and appropriate Neurologic: grossly non-focal.cranial nerves II through XII are grossly intact.           Labs on Admission:  Basic Metabolic Panel:  Recent Labs Lab 03/11/2014 0933  NA 141  K 3.8  CL 95*  CO2 34*  GLUCOSE 117*  BUN 19  CREATININE 1.05  CALCIUM 8.8   Liver Function Tests:  Recent Labs Lab 04/09/2014 0933  AST 33  ALT 23  ALKPHOS 56  BILITOT 1.6*  PROT 7.0  ALBUMIN 3.9   No results found for this basename: LIPASE, AMYLASE,  in the last 168 hours No results found for this basename: AMMONIA,   in the last 168 hours CBC:  Recent Labs Lab 04/10/2014 0933  WBC 17.0*  NEUTROABS 15.5*  HGB 13.5  HCT 41.1  MCV 91.1  PLT 195   Cardiac Enzymes:  Recent Labs Lab 03/21/2014 0933  TROPONINI <0.30    BNP (last 3 results) No results found for this basename: PROBNP,  in the last 8760 hours CBG: No results found for this basename: GLUCAP,  in the last 168 hours  Radiological Exams on Admission: Dg Chest Portable 1 View  03/27/2014   CLINICAL DATA:  Chest pain  EXAM: PORTABLE CHEST - 1 VIEW  COMPARISON:  03/21/2014  FINDINGS: Severe hyper aeration. Postoperative changes at the left apex. Heterogeneous opacities at the right apex. Lungs are otherwise stable in appearance. No pneumothorax or pleural effusion. Normal heart size. Nipple shadow at the right base.  IMPRESSION: Right upper lobe heterogeneous opacities worrisome for an inflammatory process. Followup until resolved is recommended   Electronically Signed   By: Maryclare Bean M.D.   On: 03/21/2014 09:30    EKG: Independently reviewed. Sinus tachycardia with a heart rate of 112 beats per minute and LVH.  Assessment/Plan Principal Problem:   CAP (community acquired pneumonia) Active Problems:   HYPERTENSION   Coronary atherosclerosis of artery bypass graft   COPD with exacerbation   Pleuritic chest pain   Chronic respiratory failure with hypoxia   Generalized anxiety disorder   Urinary hesitancy   1. Community acquired pneumonia. His chest x-ray revealed inflammatory changes. These changes being treated as pneumonia given his cough, pleurisy, and wheezing. He was given Levaquin in the emergency department. We will continue IV Levaquin. We will start supportive treatment with gentle IV fluids, Xopenex and Atrovent nebulizers, and Mucinex. 2. Chronic respiratory failure with COPD with exacerbation/compensated hypercapnia. He is followed by pulmonologist, Dr. Vassie Loll in [place]. We'll continue treatment as discussed in #1. We'll  add Solu-Medrol 80 mg IV every 6 hours. We'll continue oxygen. His ABG was reassuring. His elevated PCO2 is likely at baseline given his normal pH. He stopped smoking 6 years ago after a long history of tobacco abuse. 3. Chest pain,  pleuritic. This is likely secondary to pneumonia and his increase in work of breathing. His troponin I is negative x2. His EKG reveals sinus tachycardia, but no suspicious ST or T wave abnormalities. 4. Sinus tachycardia. Apparently, the patient has a history of paroxysmal atrial fibrillation. He is treated with diltiazem and aspirin. Apparently, he is allergic to warfarin. 5. Coronary artery disease with a history of CABG. As above in #3. Will restart and continue simvastatin, aspirin, Imdur, and when necessary nitroglycerin. 6. Urinary hesitancy. The patient has had urinary hesitancy for some time, but was unable to urinate spontaneously in the ED. A Foley catheter was placed. His urinalysis is not consistent with infection. We'll start Flomax and pursue a voiding trial in the next 24 hours. 7. Generalized anxiety. Apparently, the patient had been treated with Valium in the recent past. He reported weaning himself off of Valium 2 weeks ago. He had been taking 10 mg 3-4 times daily. He complains of some anxiousness. Therefore, will order Valium 2.5 mg every 6 hours as needed.   Code Status: Full code DVT Prophylaxis: Subcutaneous heparin Family Communication: No family available; discussed with patient Disposition Plan: To be determined  Time spent: One hour and 10 minutes.  [facility] Pager [phone]  **Disclaimer: This note may have been dictated with voice recognition software. Similar sounding words can inadvertently be transcribed and this note may contain transcription errors which may not have been corrected upon publication of note.** And and and I will do a more were in and in all 40 and a a and and and a a a and a a "okay will will this  is a and is a and and and mom was worried a and and and is and and an in network on her own and I and in my in her will and a a and a

## 2014-04-01 DIAGNOSIS — T50905A Adverse effect of unspecified drugs, medicaments and biological substances, initial encounter: Secondary | ICD-10-CM | POA: Diagnosis not present

## 2014-04-01 DIAGNOSIS — T50904A Poisoning by unspecified drugs, medicaments and biological substances, undetermined, initial encounter: Secondary | ICD-10-CM

## 2014-04-01 DIAGNOSIS — R739 Hyperglycemia, unspecified: Secondary | ICD-10-CM | POA: Diagnosis not present

## 2014-04-01 DIAGNOSIS — R7309 Other abnormal glucose: Secondary | ICD-10-CM

## 2014-04-01 LAB — GLUCOSE, CAPILLARY
Glucose-Capillary: 135 mg/dL — ABNORMAL HIGH (ref 70–99)
Glucose-Capillary: 121 mg/dL — ABNORMAL HIGH (ref 70–99)
Glucose-Capillary: 145 mg/dL — ABNORMAL HIGH (ref 70–99)
Glucose-Capillary: 228 mg/dL — ABNORMAL HIGH (ref 70–99)

## 2014-04-01 LAB — CBC
HCT: 40 % (ref 39.0–52.0)
Hemoglobin: 13.1 g/dL (ref 13.0–17.0)
MCH: 29.8 pg (ref 26.0–34.0)
MCHC: 32.8 g/dL (ref 30.0–36.0)
MCV: 91.1 fL (ref 78.0–100.0)
Platelets: 177 10*3/uL (ref 150–400)
RBC: 4.39 MIL/uL (ref 4.22–5.81)
RDW: 12.6 % (ref 11.5–15.5)
WBC: 14 10*3/uL — ABNORMAL HIGH (ref 4.0–10.5)

## 2014-04-01 LAB — BASIC METABOLIC PANEL WITH GFR
Anion gap: 11 (ref 5–15)
BUN: 21 mg/dL (ref 6–23)
CO2: 32 meq/L (ref 19–32)
Calcium: 8.5 mg/dL (ref 8.4–10.5)
Chloride: 93 meq/L — ABNORMAL LOW (ref 96–112)
Creatinine, Ser: 0.97 mg/dL (ref 0.50–1.35)
GFR calc Af Amer: >90 mL/min
GFR calc non Af Amer: 82 mL/min — ABNORMAL LOW
Glucose, Bld: 176 mg/dL — ABNORMAL HIGH (ref 70–99)
Potassium: 4.1 meq/L (ref 3.7–5.3)
Sodium: 136 meq/L — ABNORMAL LOW (ref 137–147)

## 2014-04-01 LAB — TROPONIN I: Troponin I: <0.3 ng/mL

## 2014-04-01 LAB — TSH: TSH: 0.809 u[IU]/mL (ref 0.350–4.500)

## 2014-04-01 MED ORDER — INSULIN DETEMIR 100 UNIT/ML ~~LOC~~ SOLN
10.0000 [IU] | Freq: Two times a day (BID) | SUBCUTANEOUS | Status: DC
  Administered 2014-04-01 – 2014-04-04 (×7): 10 [IU] via SUBCUTANEOUS
  Filled 2014-04-01 (×9): qty 0.1

## 2014-04-01 MED ORDER — LEVOFLOXACIN IN D5W 750 MG/150ML IV SOLN
750.0000 mg | INTRAVENOUS | Status: DC
  Administered 2014-04-01 – 2014-04-04 (×4): 750 mg via INTRAVENOUS
  Filled 2014-04-01 (×4): qty 150

## 2014-04-01 MED ORDER — DIAZEPAM 5 MG PO TABS
5.0000 mg | ORAL_TABLET | Freq: Once | ORAL | Status: AC
  Administered 2014-04-01: 5 mg via ORAL
  Filled 2014-04-01: qty 1

## 2014-04-01 MED ORDER — METHYLPREDNISOLONE SODIUM SUCC 125 MG IJ SOLR
60.0000 mg | Freq: Four times a day (QID) | INTRAMUSCULAR | Status: DC
  Administered 2014-04-01 – 2014-04-04 (×12): 60 mg via INTRAVENOUS
  Filled 2014-04-01 (×13): qty 2

## 2014-04-01 NOTE — Progress Notes (Signed)
Utilization review Completed Clovis Mankins RN BSN   

## 2014-04-01 NOTE — Progress Notes (Signed)
[facility] PROGRESS NOTE  [name] ZOX:[id number] DOB: 19-Feb-1946 DOA: 04/08/2014 PCP: Colette Ribas, MD    Code Status: The patient is a DO NOT RESUSCITATE following my discussion with him today. We'll order will be changed. Family Communication: Family not available; discussed with patient Disposition Plan: Discharge when clinically appropriate    Consultants:  None  Procedures:  None  Antibiotics:  Levaquin 8/21>>  HPI/Subjective: The patient complains of feeling nervous and wants diazepam to be given soon. He says that his breathing has not improved nor has it worsened. He is still short of breath. He has chest tightness.  Objective: Filed Vitals:   04/01/14 0634  BP: 117/76  Pulse:   Temp: 97.5 F (36.4 C)  Resp:    pulse 104. Respiratory rate 24. Oxygen saturation 97% on 3 L of oxygen.  Intake/Output Summary (Last 24 hours) at 04/01/14 1101 Last data filed at 04/01/14 0845  Gross per 24 hour  Intake    120 ml  Output   1125 ml  Net  -1005 ml   Filed Weights   03/29/2014 0855 04/01/14 0634 04/01/14 0838  Weight: 49.896 kg (110 lb) 57.2 kg (126 lb 1.7 oz) 57.4 kg (126 lb 8.7 oz)    Exam:   General:  Small framed frail 68 year-old man sitting up in bed with chronic dyspnea.  Cardiovascular: S1, S2, with tachycardia. No pedal edema.  Respiratory: Global decreased breath sounds throughout all lung fields. No current audible wheezes or crackles auscultated. Chronic pierced lip breathing; barrel chest.  Abdomen: Positive bowel sounds, soft, nontender, nondistended.  Musculoskeletal: No acute hot red joints.  Neuropsychiatric: He is alert and oriented x3. Mild tremor diffusely. Mild anxiousness. His speech is clear. Cranial nerves II through XII are intact.   Data Reviewed: Basic Metabolic Panel:  Recent Labs Lab 03/27/2014 0933 04/01/14 0347  NA 141 136*  K 3.8 4.1  CL 95* 93*  CO2 34* 32  GLUCOSE 117* 176*  BUN 19 21   CREATININE 1.05 0.97  CALCIUM 8.8 8.5   Liver Function Tests:  Recent Labs Lab 03/24/2014 0933  AST 33  ALT 23  ALKPHOS 56  BILITOT 1.6*  PROT 7.0  ALBUMIN 3.9   No results found for this basename: LIPASE, AMYLASE,  in the last 168 hours No results found for this basename: AMMONIA,  in the last 168 hours CBC:  Recent Labs Lab 03/18/2014 0933 04/01/14 0347  WBC 17.0* 14.0*  NEUTROABS 15.5*  --   HGB 13.5 13.1  HCT 41.1 40.0  MCV 91.1 91.1  PLT 195 177   Cardiac Enzymes:  Recent Labs Lab 04/09/2014 0933 04/02/2014 1522 03/24/2014 2058 04/01/14 0347  TROPONINI <0.30 <0.30 <0.30 <0.30   BNP (last 3 results)  Recent Labs  04/05/2014 1522  PROBNP 1416.0*   CBG:  Recent Labs Lab 03/19/2014 1552 04/01/14 0744  GLUCAP 169* 135*    No results found for this or any previous visit (from the past 240 hour(s)).   Studies: Dg Chest Portable 1 View  03/14/2014   CLINICAL DATA:  Chest pain  EXAM: PORTABLE CHEST - 1 VIEW  COMPARISON:  03/21/2014  FINDINGS: Severe hyper aeration. Postoperative changes at the left apex. Heterogeneous opacities at the right apex. Lungs are otherwise stable in appearance. No pneumothorax or pleural effusion. Normal heart size. Nipple shadow at the right base.  IMPRESSION: Right upper lobe heterogeneous opacities worrisome for an inflammatory process. Followup until resolved is recommended   Electronically  Signed   By: Maryclare Bean M.D.   On: 02-Apr-2014 09:30    Scheduled Meds: . aspirin EC  81 mg Oral Daily  . budesonide-formoterol  2 puff Inhalation BID  . diltiazem  120 mg Oral Daily  . docusate sodium  100 mg Oral BID  . guaiFENesin  600 mg Oral BID  . heparin  5,000 Units Subcutaneous 3 times per day  . insulin aspart  0-15 Units Subcutaneous TID WC  . insulin aspart  0-5 Units Subcutaneous QHS  . insulin detemir  10 Units Subcutaneous BID  . ipratropium  0.5 mg Nebulization Q4H  . isosorbide mononitrate  120 mg Oral Daily  . levalbuterol   0.63 mg Nebulization Q4H  . levofloxacin (LEVAQUIN) IV  750 mg Intravenous Q24H  . meloxicam  15 mg Oral Daily  . methylPREDNISolone (SOLU-MEDROL) injection  80 mg Intravenous 4 times per day  . pantoprazole  40 mg Oral Daily  . potassium chloride  10 mEq Oral Daily  . simvastatin  40 mg Oral QHS  . tamsulosin  0.4 mg Oral QHS   Continuous Infusions: . 0.9 % NaCl with KCl 20 mEq / L 70 mL/hr at 04/01/14 1610   Assessment and plan:  Principal Problem:   CAP (community acquired pneumonia) Active Problems:   HYPERTENSION   Coronary atherosclerosis of artery bypass graft   COPD with exacerbation   Pleuritic chest pain   Chronic respiratory failure with hypoxia   Generalized anxiety disorder   Urinary hesitancy   Hyperglycemia, drug-induced   1. Community-acquired pneumonia. We'll continue Levaquin, oxygen, Mucinex, and supportive treatment.  Oxygen-dependent COPD with exacerbation. We'll continue oxygen, IV steroids, nebulizers, and Levaquin.  Chronic respiratory failure with chronic hypoxia and hypercapnia. Patient's ABG on 3 L of oxygen following admission revealed a pH of 7.38, PCO2 of 58, PO2 of 101. This is likely his baseline.  Pleuritic chest pain in a patient with CAD. Likely secondary to pneumonia and increase in work of breathing. His troponin I was negative x3. We'll continue analgesics as needed.  Generalized anxiety disorder. Diazepam ordered and given at 2.5 mg every 6 hours as needed.  Urinary hesitancy. Foley catheter was placed in the ED. Flomax was started. Will do a voiding trial in a couple of days.  Steroid-induced hyperglycemia. We'll continue sliding scale NovoLog and Levemir. We'll order hemoglobin A1c.   Time spent: 35 minutes    [facility]  [facility] Pager [phone]. If 7PM-7AM, please contact night-coverage at [email or web address], password [facility] 04/01/2014, 11:01 AM  LOS: 1 day

## 2014-04-02 LAB — HEMOGLOBIN A1C
Hgb A1c MFr Bld: 5.6 % (ref <5.7)
Mean Plasma Glucose: 114 mg/dL (ref <117)

## 2014-04-02 LAB — GLUCOSE, CAPILLARY
GLUCOSE-CAPILLARY: 123 mg/dL — AB (ref 70–99)
GLUCOSE-CAPILLARY: 130 mg/dL — AB (ref 70–99)
Glucose-Capillary: 137 mg/dL — ABNORMAL HIGH (ref 70–99)

## 2014-04-02 MED ORDER — MAGNESIUM HYDROXIDE 400 MG/5ML PO SUSP
30.0000 mL | Freq: Once | ORAL | Status: AC
  Administered 2014-04-02: 30 mL via ORAL
  Filled 2014-04-02: qty 30

## 2014-04-02 MED ORDER — BISACODYL 10 MG RE SUPP
10.0000 mg | Freq: Once | RECTAL | Status: AC
  Administered 2014-04-02: 10 mg via RECTAL
  Filled 2014-04-02: qty 1

## 2014-04-02 MED ORDER — DIAZEPAM 5 MG PO TABS
5.0000 mg | ORAL_TABLET | Freq: Four times a day (QID) | ORAL | Status: DC | PRN
  Administered 2014-04-02 – 2014-04-11 (×17): 5 mg via ORAL
  Filled 2014-04-02 (×17): qty 1

## 2014-04-02 NOTE — Progress Notes (Signed)
[facility] PROGRESS NOTE  [name] BJY:[id number] DOB: 1946/05/13 DOA: 14-Apr-2014 PCP: Colette Ribas, MD    Code Status: The patient is a DO NOT RESUSCITATE following my discussion with him. Family Communication: Family not available; discussed with patient Disposition Plan: Discharge when clinically appropriate    Consultants:  None  Procedures:  None  Antibiotics:  Levaquin 8/21>>  HPI/Subjective: The patient complains of feeling nervous, worse after breathig treatment He says that his breathing has not improved nor has it worsened. He is still short of breath. He is starting to cough up yellow mucus.  Objective: Filed Vitals:   04/02/14 0623  BP: 121/70  Pulse: 105  Temp: 97.6 F (36.4 C)  Resp: 22   pulse 105. Respiratory rate 24. Oxygen saturation 96% on 3 L of oxygen.  Intake/Output Summary (Last 24 hours) at 04/02/14 1438 Last data filed at 04/02/14 1112  Gross per 24 hour  Intake      0 ml  Output   1850 ml  Net  -1850 ml   Filed Weights   04/01/14 0634 04/01/14 0838 04/02/14 0623  Weight: 57.2 kg (126 lb 1.7 oz) 57.4 kg (126 lb 8.7 oz) 57.8 kg (127 lb 6.8 oz)    Exam:   General:  Small framed frail 68 year-old man sitting up in bed with chronic dyspnea.  Cardiovascular: S1, S2, with tachycardia. No pedal edema.  Respiratory: Global decreased breath sounds throughout all lung fields. Now with audible wheezes and fine crackles auscultated. Chronic pierced lip breathing; barrel chest.  Abdomen: Positive bowel sounds, soft, nontender, nondistended.  Musculoskeletal: No acute hot red joints.  Neuropsychiatric: He is alert and oriented x3. Mild tremor diffusely. Mild anxiousness. His speech is clear. Cranial nerves II through XII are intact.   Data Reviewed: Basic Metabolic Panel:  Recent Labs Lab 04/14/2014 0933 04/01/14 0347  NA 141 136*  K 3.8 4.1  CL 95* 93*  CO2 34* 32  GLUCOSE 117* 176*  BUN 19 21  CREATININE 1.05  0.97  CALCIUM 8.8 8.5   Liver Function Tests:  Recent Labs Lab 04/14/2014 0933  AST 33  ALT 23  ALKPHOS 56  BILITOT 1.6*  PROT 7.0  ALBUMIN 3.9   No results found for this basename: LIPASE, AMYLASE,  in the last 168 hours No results found for this basename: AMMONIA,  in the last 168 hours CBC:  Recent Labs Lab 14-Apr-2014 0933 04/01/14 0347  WBC 17.0* 14.0*  NEUTROABS 15.5*  --   HGB 13.5 13.1  HCT 41.1 40.0  MCV 91.1 91.1  PLT 195 177   Cardiac Enzymes:  Recent Labs Lab April 14, 2014 0933 April 14, 2014 1522 14-Apr-2014 2058 04/01/14 0347  TROPONINI <0.30 <0.30 <0.30 <0.30   BNP (last 3 results)  Recent Labs  04-14-14 1522  PROBNP 1416.0*   CBG:  Recent Labs Lab 04/01/14 1211 04/01/14 1650 04/01/14 2231 04/02/14 0726 04/02/14 1232  GLUCAP 121* 145* 228* 123* 130*    No results found for this or any previous visit (from the past 240 hour(s)).   Studies: No results found.  Scheduled Meds: . aspirin EC  81 mg Oral Daily  . budesonide-formoterol  2 puff Inhalation BID  . diltiazem  120 mg Oral Daily  . docusate sodium  100 mg Oral BID  . guaiFENesin  600 mg Oral BID  . heparin  5,000 Units Subcutaneous 3 times per day  . insulin aspart  0-15 Units Subcutaneous TID WC  . insulin aspart  0-5 Units Subcutaneous QHS  . insulin detemir  10 Units Subcutaneous BID  . ipratropium  0.5 mg Nebulization Q4H  . isosorbide mononitrate  120 mg Oral Daily  . levalbuterol  0.63 mg Nebulization Q4H  . levofloxacin (LEVAQUIN) IV  750 mg Intravenous Q24H  . meloxicam  15 mg Oral Daily  . methylPREDNISolone (SOLU-MEDROL) injection  60 mg Intravenous 4 times per day  . pantoprazole  40 mg Oral Daily  . potassium chloride  10 mEq Oral Daily  . simvastatin  40 mg Oral QHS  . tamsulosin  0.4 mg Oral QHS   Continuous Infusions: . 0.9 % NaCl with KCl 20 mEq / L 50 mL/hr at 04/01/14 1225   Assessment and plan:  Principal Problem:   CAP (community acquired pneumonia) Active  Problems:   HYPERTENSION   Coronary atherosclerosis of artery bypass graft   COPD with exacerbation   Pleuritic chest pain   Chronic respiratory failure with hypoxia   Generalized anxiety disorder   Urinary hesitancy   Hyperglycemia, drug-induced   1. Community-acquired pneumonia. We'll continue Levaquin, oxygen, Mucinex, and supportive treatment.  Oxygen-dependent COPD with exacerbation. With faint wheezes, may indicate better air movement.We'll continue oxygen, IV steroids, nebulizers, Symbicort, Mucinex and Levaquin. Anticipate slow recovery. Chronic respiratory failure with chronic hypoxia and hypercapnia. Patient's ABG on 3 L of oxygen following admission revealed a pH of 7.38, PCO2 of 58, PO2 of 101. This is likely his baseline. Pleuritic chest pain in a patient with CAD. Likely secondary to pneumonia and increase in work of breathing. His troponin I was negative x3. We'll continue analgesics as needed. Generalized anxiety disorder. He was recently weaned off of diazepam 10 mg 4 times daily.Diazepam ordered per his request because of anxiousness. Will titrate to 5 mg from 2.5 mg  every 6 hours as needed. TSH is within normal limits. Urinary hesitancy. Foley catheter was placed in the ED. Flomax was started. UA negative for wbcs. Will do a voiding trial when clinically improved.. Steroid-induced hyperglycemia. We'll continue sliding scale NovoLog and Levemir. Hemoglobin A1c is 5.6.   Time spent: 30 minutes    [facility]  [facility] Pager [phone]. If 7PM-7AM, please contact night-coverage at [email or web address], password [facility] 04/02/2014, 2:38 PM  LOS: 2 days

## 2014-04-03 LAB — BASIC METABOLIC PANEL
ANION GAP: 6 (ref 5–15)
BUN: 29 mg/dL — ABNORMAL HIGH (ref 6–23)
CHLORIDE: 98 meq/L (ref 96–112)
CO2: 34 meq/L — AB (ref 19–32)
Calcium: 8 mg/dL — ABNORMAL LOW (ref 8.4–10.5)
Creatinine, Ser: 0.92 mg/dL (ref 0.50–1.35)
GFR calc Af Amer: >90 mL/min (ref >90)
GFR calc non Af Amer: 84 mL/min — ABNORMAL LOW (ref >90)
Glucose, Bld: 128 mg/dL — ABNORMAL HIGH (ref 70–99)
Potassium: 5.1 mEq/L (ref 3.7–5.3)
SODIUM: 138 meq/L (ref 137–147)

## 2014-04-03 LAB — CBC
HCT: 38.6 % — ABNORMAL LOW (ref 39.0–52.0)
Hemoglobin: 12.5 g/dL — ABNORMAL LOW (ref 13.0–17.0)
MCH: 30 pg (ref 26.0–34.0)
MCHC: 32.4 g/dL (ref 30.0–36.0)
MCV: 92.6 fL (ref 78.0–100.0)
PLATELETS: 221 10*3/uL (ref 150–400)
RBC: 4.17 MIL/uL — AB (ref 4.22–5.81)
RDW: 12.6 % (ref 11.5–15.5)
WBC: 8.9 10*3/uL (ref 4.0–10.5)

## 2014-04-03 LAB — GLUCOSE, CAPILLARY
GLUCOSE-CAPILLARY: 133 mg/dL — AB (ref 70–99)
GLUCOSE-CAPILLARY: 184 mg/dL — AB (ref 70–99)
Glucose-Capillary: 138 mg/dL — ABNORMAL HIGH (ref 70–99)

## 2014-04-03 MED ORDER — ENSURE COMPLETE PO LIQD
237.0000 mL | Freq: Two times a day (BID) | ORAL | Status: DC
  Administered 2014-04-03 – 2014-04-10 (×15): 237 mL via ORAL

## 2014-04-03 MED ORDER — SODIUM CHLORIDE 0.9 % IV SOLN
INTRAVENOUS | Status: DC
  Administered 2014-04-03 – 2014-04-05 (×3): via INTRAVENOUS

## 2014-04-03 NOTE — Plan of Care (Signed)
Problem: Phase I Progression Outcomes Goal: Voiding-avoid urinary catheter unless indicated Outcome: Not Applicable Date Met:  63/49/49 04/03/14 1017 Foley catheter placed for urinary retention per MD notes. Donavan Foil, RN

## 2014-04-03 NOTE — Progress Notes (Signed)
04/03/14 1154 Patient to keep foley catheter in place for urinary retention, respiratory status per Dr. Sherrie Mustache. Earnstine Regal, RN

## 2014-04-03 NOTE — Progress Notes (Signed)
[facility] PROGRESS NOTE  [name] ZOX:[id number] DOB: October 16, 1945 DOA: 2014/04/07 PCP: Colette Ribas, MD    Code Status: The patient is a DO NOT RESUSCITATE following my discussion with him. Family Communication: Family not available; discussed with patient Disposition Plan: Discharge when clinically appropriate    Consultants:  None  Procedures:  None  Antibiotics:  Levaquin 8/21>>  HPI/Subjective:  Brief history: The patient is a 68 year old man with a history significant for severe oxygen-dependent COPD and coronary artery disease-status post CABG who presented to the emergency department on 04-07-14 with a complaint of chest pain and shortness of breath. In the ED, his chest x-ray revealed right upper lobe opacities, worrisome for an inflammatory process. His ABG on 3 L of oxygen revealed a pH of 7.38, PCO2 of 58, and PO2 of 101. He had difficulty urinating and he Foley catheter was placed in the ED. Flomax was added empirically. His urinalysis revealed no sign of infection. His cardiac enzymes have been negative. He is being treated with Xopenex and Atrovent nebulizers, IV Levaquin IV Solu-Medrol. Valium was restarted and titrated up to 5 mg every 6 hours as needed for anxiousness. He is slow to recover and will likely need several more days of hospitalization. We'll keep the Foley catheter in for now and would start a voiding trial approximately 24 hours before the anticipated discharge.    Subjective: The patient feels less anxious with the 5 mg dosing of Valium. He has congestion in his throat and he is having difficulty expectorating. Otherwise, he feels about the same regarding his breathing.  Objective: Filed Vitals:   04/03/14 0633  BP: 155/67  Pulse: 100  Temp: 98.1 F (36.7 C)  Resp: 18   pulse 105. Respiratory rate 18. Oxygen saturation 99% on 3 L of oxygen.  Intake/Output Summary (Last 24 hours) at 04/03/14 1244 Last data filed at  04/03/14 1213  Gross per 24 hour  Intake    510 ml  Output    800 ml  Net   -290 ml   Filed Weights   04/01/14 0838 04/02/14 0623 04/03/14 8119  Weight: 57.4 kg (126 lb 8.7 oz) 57.8 kg (127 lb 6.8 oz) 61.3 kg (135 lb 2.3 oz)    Exam:   General:  Small framed frail 68 year-old man sitting up in bed with chronic dyspnea.  Cardiovascular: S1, S2, with tachycardia. No pedal edema.  Respiratory: Upper airway wheezes and crackles, partially cleared with coughing otherwise globally decreased breath sounds bilaterally. Chronic pierced lip breathing; barrel chest.  Abdomen: Positive bowel sounds, soft, nontender, nondistended.  Musculoskeletal: No acute hot red joints.  Neuropsychiatric: He is alert and oriented x3. Mild tremor diffusely. Less anxiousness. His speech is clear. Cranial nerves II through XII are intact.   Data Reviewed: Basic Metabolic Panel:  Recent Labs Lab 2014-04-07 0933 04/01/14 0347 04/03/14 0632  NA 141 136* 138  K 3.8 4.1 5.1  CL 95* 93* 98  CO2 34* 32 34*  GLUCOSE 117* 176* 128*  BUN 19 21 29*  CREATININE 1.05 0.97 0.92  CALCIUM 8.8 8.5 8.0*   Liver Function Tests:  Recent Labs Lab 07-Apr-2014 0933  AST 33  ALT 23  ALKPHOS 56  BILITOT 1.6*  PROT 7.0  ALBUMIN 3.9   No results found for this basename: LIPASE, AMYLASE,  in the last 168 hours No results found for this basename: AMMONIA,  in the last 168 hours CBC:  Recent Labs Lab 07-Apr-2014 0933 04/01/14 0347  04/03/14 0632  WBC 17.0* 14.0* 8.9  NEUTROABS 15.5*  --   --   HGB 13.5 13.1 12.5*  HCT 41.1 40.0 38.6*  MCV 91.1 91.1 92.6  PLT 195 177 221   Cardiac Enzymes:  Recent Labs Lab 04/21/14 0933 2014-04-21 1522 April 21, 2014 2058 04/01/14 0347  TROPONINI <0.30 <0.30 <0.30 <0.30   BNP (last 3 results)  Recent Labs  2014-04-21 1522  PROBNP 1416.0*   CBG:  Recent Labs Lab 04/02/14 0726 04/02/14 1232 04/02/14 1639 04/03/14 0756 04/03/14 1135  GLUCAP 123* 130* 137* 133* 184*     No results found for this or any previous visit (from the past 240 hour(s)).   Studies: No results found.  Scheduled Meds: . aspirin EC  81 mg Oral Daily  . budesonide-formoterol  2 puff Inhalation BID  . diltiazem  120 mg Oral Daily  . docusate sodium  100 mg Oral BID  . feeding supplement (ENSURE COMPLETE)  237 mL Oral BID BM  . guaiFENesin  600 mg Oral BID  . heparin  5,000 Units Subcutaneous 3 times per day  . insulin aspart  0-15 Units Subcutaneous TID WC  . insulin aspart  0-5 Units Subcutaneous QHS  . insulin detemir  10 Units Subcutaneous BID  . ipratropium  0.5 mg Nebulization Q4H  . isosorbide mononitrate  120 mg Oral Daily  . levalbuterol  0.63 mg Nebulization Q4H  . levofloxacin (LEVAQUIN) IV  750 mg Intravenous Q24H  . meloxicam  15 mg Oral Daily  . methylPREDNISolone (SOLU-MEDROL) injection  60 mg Intravenous 4 times per day  . pantoprazole  40 mg Oral Daily  . potassium chloride  10 mEq Oral Daily  . simvastatin  40 mg Oral QHS  . tamsulosin  0.4 mg Oral QHS   Continuous Infusions: . 0.9 % NaCl with KCl 20 mEq / L 50 mL/hr at 04/03/14 0022   Assessment and plan:  Principal Problem:   CAP (community acquired pneumonia) Active Problems:   HYPERTENSION   Coronary atherosclerosis of artery bypass graft   COPD with exacerbation   Pleuritic chest pain   Chronic respiratory failure with hypoxia   Generalized anxiety disorder   Urinary hesitancy   Hyperglycemia, drug-induced   1. Community-acquired pneumonia. We'll continue Levaquin, oxygen, Mucinex, and supportive treatment. We'll order a followup chest x-ray for tomorrow morning. Oxygen-dependent COPD with exacerbation. He has globally decreased breath sounds bilaterally and upper airway wheezes and crackles as the patient has difficulty expectorating. We'll continue oxygen, IV steroids, nebulizers, Symbicort, Mucinex and Levaquin. Anticipate continued slow recovery. Chronic respiratory failure with  chronic hypoxia and hypercapnia. Patient's ABG on 3 L of oxygen following admission revealed a pH of 7.38, PCO2 of 58, PO2 of 101. This is likely his baseline. Pleuritic chest pain in a patient with CAD. Likely secondary to pneumonia and increase in work of breathing. His troponin I was negative x3. We'll continue analgesics as needed. Generalized anxiety disorder. He was recently weaned off of diazepam 10 mg 4 times daily. Diazepam ordered per his request because of anxiousness and titrated to 5 mg from 2.5 mg  every 6 hours as needed. TSH is within normal limits. Urinary hesitancy. Foley catheter was placed in the ED. Flomax was started. UA negative for wbcs. Will do a voiding trial when he is clinically improved.. Steroid-induced hyperglycemia. We'll continue sliding scale NovoLog and Levemir. Hemoglobin A1c is 5.6.     Time spent: 25 minutes    [facility]  [facility] Pager  [phone]. If 7PM-7AM, please contact night-coverage at [email or web address], password [facility] 04/03/2014, 12:44 PM  LOS: 3 days

## 2014-04-04 ENCOUNTER — Inpatient Hospital Stay (HOSPITAL_COMMUNITY): Payer: Medicare Other

## 2014-04-04 DIAGNOSIS — F411 Generalized anxiety disorder: Secondary | ICD-10-CM

## 2014-04-04 LAB — GLUCOSE, CAPILLARY
GLUCOSE-CAPILLARY: 141 mg/dL — AB (ref 70–99)
GLUCOSE-CAPILLARY: 110 mg/dL — AB (ref 70–99)
Glucose-Capillary: 106 mg/dL — ABNORMAL HIGH (ref 70–99)
Glucose-Capillary: 162 mg/dL — ABNORMAL HIGH (ref 70–99)
Glucose-Capillary: 159 mg/dL — ABNORMAL HIGH (ref 70–99)
Glucose-Capillary: 100 mg/dL — ABNORMAL HIGH (ref 70–99)

## 2014-04-04 LAB — BASIC METABOLIC PANEL
Anion gap: 7 (ref 5–15)
BUN: 28 mg/dL — ABNORMAL HIGH (ref 6–23)
CALCIUM: 8.2 mg/dL — AB (ref 8.4–10.5)
CO2: 34 meq/L — AB (ref 19–32)
CREATININE: 0.87 mg/dL (ref 0.50–1.35)
Chloride: 99 mEq/L (ref 96–112)
GFR calc Af Amer: >90 mL/min (ref >90)
GFR calc non Af Amer: 86 mL/min — ABNORMAL LOW (ref >90)
Glucose, Bld: 108 mg/dL — ABNORMAL HIGH (ref 70–99)
Potassium: 5.2 mEq/L (ref 3.7–5.3)
Sodium: 140 mEq/L (ref 137–147)

## 2014-04-04 MED ORDER — METHYLPREDNISOLONE SODIUM SUCC 40 MG IJ SOLR
40.0000 mg | Freq: Four times a day (QID) | INTRAMUSCULAR | Status: DC
  Administered 2014-04-04 – 2014-04-06 (×7): 40 mg via INTRAVENOUS
  Filled 2014-04-04 (×7): qty 1

## 2014-04-04 MED ORDER — INSULIN DETEMIR 100 UNIT/ML ~~LOC~~ SOLN
5.0000 [IU] | Freq: Two times a day (BID) | SUBCUTANEOUS | Status: DC
  Administered 2014-04-04 – 2014-04-10 (×12): 5 [IU] via SUBCUTANEOUS
  Filled 2014-04-04 (×16): qty 0.05

## 2014-04-04 MED ORDER — SENNOSIDES-DOCUSATE SODIUM 8.6-50 MG PO TABS
1.0000 | ORAL_TABLET | Freq: Every day | ORAL | Status: DC
  Administered 2014-04-04 – 2014-04-11 (×7): 1 via ORAL
  Filled 2014-04-04 (×7): qty 1

## 2014-04-04 MED ORDER — LEVALBUTEROL HCL 0.63 MG/3ML IN NEBU
0.6300 mg | INHALATION_SOLUTION | Freq: Four times a day (QID) | RESPIRATORY_TRACT | Status: DC
  Administered 2014-04-04 – 2014-04-06 (×10): 0.63 mg via RESPIRATORY_TRACT
  Filled 2014-04-04 (×10): qty 3

## 2014-04-04 MED ORDER — BISACODYL 10 MG RE SUPP
10.0000 mg | Freq: Once | RECTAL | Status: AC
  Administered 2014-04-04: 10 mg via RECTAL
  Filled 2014-04-04: qty 1

## 2014-04-04 MED ORDER — IPRATROPIUM BROMIDE 0.02 % IN SOLN
0.5000 mg | Freq: Four times a day (QID) | RESPIRATORY_TRACT | Status: DC
Start: 2014-04-04 — End: 2014-04-06
  Administered 2014-04-04 – 2014-04-06 (×10): 0.5 mg via RESPIRATORY_TRACT
  Filled 2014-04-04 (×10): qty 2.5

## 2014-04-04 MED ORDER — MAGNESIUM HYDROXIDE 400 MG/5ML PO SUSP
30.0000 mL | Freq: Once | ORAL | Status: AC
  Administered 2014-04-04: 30 mL via ORAL
  Filled 2014-04-04: qty 30

## 2014-04-04 MED ORDER — LEVOFLOXACIN 750 MG PO TABS
750.0000 mg | ORAL_TABLET | Freq: Every day | ORAL | Status: DC
  Administered 2014-04-05 – 2014-04-07 (×3): 750 mg via ORAL
  Filled 2014-04-04 (×3): qty 1

## 2014-04-04 NOTE — Progress Notes (Signed)
PHARMACIST - PHYSICIAN COMMUNICATION DR:   Fisher  CONCERNING: Antibiotic IV to Oral Route Change Policy  RECOMMENDATION: This patient is receiving Levaquin by the intravenous route.  Based on criteria approved by the Pharmacy and Therapeutics Committee, the antibiotic(s) is/are being converted to the equivalent oral dose form(s).   DESCRIPTION: These criteria include:  Patient being treated for a respiratory tract infection, urinary tract infection, cellulitis or clostridium difficile associated diarrhea if on metronidazole  The patient is not neutropenic and does not exhibit a GI malabsorption state  The patient is eating (either orally or via tube) and/or has been taking other orally administered medications for a least 24 hours  The patient is improving clinically and has a Tmax < 100.5  If you have questions about this conversion, please contact the Pharmacy Department  [x]  ( [phone] )  [place] []  ( [phone] )  [place]  []  ( [phone] )  [facility] []  ( [phone] )  [facility]   S. Loana Salvaggio, PharmD  

## 2014-04-04 NOTE — Progress Notes (Signed)
[facility] PROGRESS NOTE  [name]:[id number] DOB: November 07, 1945 DOA: 04/09/2014 PCP: Colette Ribas, MD    Code Status: The patient is a DO NOT RESUSCITATE following my discussion with him. Family Communication: Family not available; discussed with patient Disposition Plan: Discharge when clinically appropriate    Consultants:  None  Procedures:  None  Antibiotics:  Levaquin 8/21>>  HPI/Subjective:  Brief history: The patient is a 68 year old man with a history significant for severe oxygen-dependent COPD and coronary artery disease-status post CABG who presented to the emergency department on 03/14/2014 with a complaint of chest pain and shortness of breath. In the ED, his chest x-ray revealed right upper lobe opacities, worrisome for an inflammatory process. His ABG on 3 L of oxygen revealed a pH of 7.38, PCO2 of 58, and PO2 of 101. He had difficulty urinating and a Foley catheter was placed in the ED. Flomax was added empirically. His urinalysis revealed no sign of infection. His cardiac enzymes have been negative. He is being treated with Xopenex and Atrovent nebulizers, IV>>po Levaquin IV Solu-Medrol. Valium was restarted and titrated up to 5 mg every 6 hours as needed for anxiousness. He is slow to recover and will likely need several more days of hospitalization. We'll keep the Foley catheter in for now and would start a voiding trial at approximately 24 hours before the anticipated discharge.... which will likely be toward the end of this week. We'll order PT evaluation for tomorrow.    Subjective: He complains of constipation. He feels less anxious with the 5 mg dosing of Valium. He has congestion in his throat and he is having difficulty expectorating. Otherwise, he thinks he is actually breathing a little bit better today.  Objective: Filed Vitals:   04/04/14 1420  BP: 151/74  Pulse: 109  Temp: 98 F (36.7 C)  Resp: 20   pulse 109  Respiratory  rate 20. Oxygen saturation 97% on 3 L of oxygen.  Intake/Output Summary (Last 24 hours) at 04/04/14 1714 Last data filed at 04/04/14 1538  Gross per 24 hour  Intake 1350.83 ml  Output   3200 ml  Net -1849.17 ml   Filed Weights   04/02/14 0623 04/03/14 0633 04/04/14 0606  Weight: 57.8 kg (127 lb 6.8 oz) 61.3 kg (135 lb 2.3 oz) 58.242 kg (128 lb 6.4 oz)    Exam:   General:  Small framed frail 68 year-old man sitting up in bed with chronic dyspnea.  Cardiovascular: S1, S2, with tachycardia. No pedal edema.  Respiratory: Upper airway wheezes and crackles, partially cleared with coughing otherwise globally decreased breath sounds bilaterally. Chronic pierced lip breathing; barrel chest.  Abdomen: Positive bowel sounds, soft, nontender, nondistended.  Musculoskeletal: No acute hot red joints.  Neuropsychiatric: He is alert and oriented x3. Mild tremor diffusely. Less anxiousness. His speech is clear. Cranial nerves II through XII are intact.   Data Reviewed: Basic Metabolic Panel:  Recent Labs Lab 03/16/2014 0933 04/01/14 0347 04/03/14 0632 04/04/14 0620  NA 141 136* 138 140  K 3.8 4.1 5.1 5.2  CL 95* 93* 98 99  CO2 34* 32 34* 34*  GLUCOSE 117* 176* 128* 108*  BUN 19 21 29* 28*  CREATININE 1.05 0.97 0.92 0.87  CALCIUM 8.8 8.5 8.0* 8.2*   Liver Function Tests:  Recent Labs Lab 03/30/2014 0933  AST 33  ALT 23  ALKPHOS 56  BILITOT 1.6*  PROT 7.0  ALBUMIN 3.9   No results found for this basename: LIPASE, AMYLASE,  in the last 168 hours No results found for this basename: AMMONIA,  in the last 168 hours CBC:  Recent Labs Lab 03/20/2014 0933 04/01/14 0347 04/03/14 0632  WBC 17.0* 14.0* 8.9  NEUTROABS 15.5*  --   --   HGB 13.5 13.1 12.5*  HCT 41.1 40.0 38.6*  MCV 91.1 91.1 92.6  PLT 195 177 221   Cardiac Enzymes:  Recent Labs Lab 04/05/2014 0933 04/05/2014 1522 03/30/2014 2058 04/01/14 0347  TROPONINI <0.30 <0.30 <0.30 <0.30   BNP (last 3 results)  Recent  Labs  04/09/2014 1522  PROBNP 1416.0*   CBG:  Recent Labs Lab 04/03/14 1644 04/03/14 2231 04/04/14 0718 04/04/14 0812 04/04/14 1121  GLUCAP 138* 141* 110* 106* 162*    No results found for this or any previous visit (from the past 240 hour(s)).   Studies: Dg Chest Port 1 View  04/04/2014   CLINICAL DATA:  Followup pneumonia.  COPD exacerbation.  EXAM: PORTABLE CHEST - 1 VIEW  COMPARISON:  Chest x-ray 03/21/2014, 03/21/2014. Two-view chest x-ray 03/07/2013, 07/07/2010.  FINDINGS: Interval resolution of the patchy airspace opacities in the right upper lobe since the examination 4 days ago. No new pulmonary parenchymal abnormalities. Changes of COPD with marked hyperinflation, emphysematous changes and prominent bronchovascular markings diffusely, unchanged. Pleuroparenchymal scarring at the bases, unchanged.  Prior sternotomy for CABG. Cardiac silhouette normal in size. Thoracic aorta tortuous and atherosclerotic, unchanged. Hilar and mediastinal contours otherwise unremarkable. Surgical clips in the upper mediastinum on the left related to the prior CABG.  IMPRESSION: Resolution of right upper lobe pneumonia. No acute cardiopulmonary disease currently. Severe COPD/emphysema.   Electronically Signed   By: Hulan Saas M.D.   On: 04/04/2014 08:05    Scheduled Meds: . aspirin EC  81 mg Oral Daily  . budesonide-formoterol  2 puff Inhalation BID  . diltiazem  120 mg Oral Daily  . feeding supplement (ENSURE COMPLETE)  237 mL Oral BID BM  . guaiFENesin  600 mg Oral BID  . heparin  5,000 Units Subcutaneous 3 times per day  . insulin aspart  0-15 Units Subcutaneous TID WC  . insulin aspart  0-5 Units Subcutaneous QHS  . insulin detemir  10 Units Subcutaneous BID  . ipratropium  0.5 mg Nebulization QID  . isosorbide mononitrate  120 mg Oral Daily  . levalbuterol  0.63 mg Nebulization QID  . [START ON 04/05/2014] levofloxacin  750 mg Oral Daily  . meloxicam  15 mg Oral Daily  .  methylPREDNISolone (SOLU-MEDROL) injection  60 mg Intravenous 4 times per day  . pantoprazole  40 mg Oral Daily  . senna-docusate  1 tablet Oral QHS  . simvastatin  40 mg Oral QHS  . tamsulosin  0.4 mg Oral QHS   Continuous Infusions: . sodium chloride 50 mL/hr at 04/04/14 1105   Assessment and plan:  Principal Problem:   CAP (community acquired pneumonia) Active Problems:   HYPERTENSION   Coronary atherosclerosis of artery bypass graft   COPD with exacerbation   Pleuritic chest pain   Chronic respiratory failure with hypoxia   Generalized anxiety disorder   Urinary retention   Hyperglycemia, drug-induced   1. Community-acquired pneumonia. We'll continue Levaquin, oxygen, Mucinex, and supportive treatment. His followup chest x-ray on today 04/04/14 reveals resolution of right upper lung opacities. We'll continue antibiotics empirically for total of 7 days. Oxygen-dependent COPD with exacerbation. He has globally decreased breath sounds bilaterally and upper airway wheezes and crackles as the patient has difficulty expectorating. We'll  continue oxygen, IV steroids, nebulizers, Symbicort, Mucinex and Levaquin. Will decrease Solu-Medrol to 40 mg IV every 6 hours. We'll add a flutter valve to see if it helps. Anticipate continued slow recovery. Chronic respiratory failure with chronic hypoxia and hypercapnia. Patient's ABG on 3 L of oxygen following admission revealed a pH of 7.38, PCO2 of 58, PO2 of 101. This is likely his baseline. Pleuritic chest pain in a patient with CAD. Likely secondary to pneumonia and increase in work of breathing. His troponin I was negative x3. We'll continue analgesics as needed. Generalized anxiety disorder. He was recently weaned off of diazepam 10 mg 4 times daily. Diazepam ordered per his request because of anxiousness and titrated to 5 mg from 2.5 mg  every 6 hours as needed. TSH is within normal limits. Urinary hesitancy/retention. Foley catheter was  placed in the ED. Flomax was started. UA negative for wbcs. Would do a voiding trial when he is clinically improved and approximately 24 hours before anticipated discharge.. Steroid-induced hyperglycemia. We'll continue sliding scale NovoLog and Levemir. Hemoglobin A1c is 5.6. Constipation. When necessary Dulcolax suppository and laxative therapy started.   Disposition: Will consult PT as the patient is slightly improved. Last nursing staff to get the patient out of bed to the chair starting tomorrow. He may need skilled nursing facility placement, but he has already refused this.   Time spent: 25 minutes    [facility]  [facility] Pager [phone]. If 7PM-7AM, please contact night-coverage at [email or web address], password [facility] 04/04/2014, 5:14 PM  LOS: 4 days

## 2014-04-05 DIAGNOSIS — I4891 Unspecified atrial fibrillation: Secondary | ICD-10-CM

## 2014-04-05 LAB — GLUCOSE, CAPILLARY
GLUCOSE-CAPILLARY: 148 mg/dL — AB (ref 70–99)
Glucose-Capillary: 101 mg/dL — ABNORMAL HIGH (ref 70–99)
Glucose-Capillary: 144 mg/dL — ABNORMAL HIGH (ref 70–99)

## 2014-04-05 MED ORDER — FUROSEMIDE 10 MG/ML IJ SOLN
40.0000 mg | Freq: Two times a day (BID) | INTRAMUSCULAR | Status: DC
  Administered 2014-04-06 (×2): 40 mg via INTRAVENOUS
  Filled 2014-04-05 (×2): qty 4

## 2014-04-05 MED ORDER — MILK AND MOLASSES ENEMA
1.0000 | Freq: Once | RECTAL | Status: AC
  Administered 2014-04-05: 250 mL via RECTAL

## 2014-04-05 NOTE — Progress Notes (Signed)
[facility] PROGRESS NOTE  [name] KGM:[id number] DOB: [birth year] DOA: 03/19/2014 PCP: Colette Ribas, MD    Code Status:  DO NOT RESUSCITATE Family Communication: Family not available; discussed with patient Disposition Plan: Discharge when clinically appropriate    Consultants:  None  Procedures:  None  Antibiotics:  Levaquin 8/21>>  Brief history: The patient is a 68 year old man with a history significant for severe oxygen-dependent COPD and coronary artery disease-status post CABG who presented to the emergency department on 04/05/2014 with a complaint of chest pain and shortness of breath. In the ED, his chest x-ray revealed right upper lobe opacities, worrisome for an inflammatory process. His ABG on 3 L of oxygen revealed a pH of 7.38, PCO2 of 58, and PO2 of 101. He had difficulty urinating and a Foley catheter was placed in the ED. Flomax was added empirically. His urinalysis revealed no sign of infection. His cardiac enzymes have been negative. He is being treated with Xopenex and Atrovent nebulizers, IV>>po Levaquin IV Solu-Medrol. Valium was restarted and titrated up to 5 mg every 6 hours as needed for anxiousness. He is slow to recover and will likely need several more days of hospitalization. We'll keep the Foley catheter in for now and would start a voiding trial at approximately 24 hours before the anticipated discharge.... which will likely be toward the end of this week.     Subjective: Still feels short of breath. Having difficulty expectorating sputum.  Objective: Filed Vitals:   04/05/14 1558  BP: 132/77  Pulse: 100  Temp: 97.9 F (36.6 C)  Resp: 18    Intake/Output Summary (Last 24 hours) at 04/05/14 2031 Last data filed at 04/05/14 1700  Gross per 24 hour  Intake 1272.17 ml  Output   1600 ml  Net -327.83 ml   Filed Weights   04/03/14 6440 04/04/14 0606 04/05/14 0458  Weight: 61.3 kg (135 lb 2.3 oz) 58.242 kg (128 lb 6.4 oz)  63.4 kg (139 lb 12.4 oz)    Exam:   General:  Small framed frail 68 year-old man sitting up in bed with chronic dyspnea.  Cardiovascular: S1, S2, with tachycardia. 1+ pedal edema.  Respiratory: Upper airway wheezes and crackles, partially cleared with coughing otherwise globally decreased breath sounds bilaterally. Chronic pursed lip breathing; barrel chest.  Abdomen: Positive bowel sounds, soft, nontender, nondistended.  Musculoskeletal: No acute hot red joints.  Neuropsychiatric: He is alert and oriented x3. Mild tremor diffusely. Less anxiousness. His speech is clear. Cranial nerves II through XII are intact.   Data Reviewed: Basic Metabolic Panel:  Recent Labs Lab 04/10/2014 0933 04/01/14 0347 04/03/14 0632 04/04/14 0620  NA 141 136* 138 140  K 3.8 4.1 5.1 5.2  CL 95* 93* 98 99  CO2 34* 32 34* 34*  GLUCOSE 117* 176* 128* 108*  BUN 19 21 29* 28*  CREATININE 1.05 0.97 0.92 0.87  CALCIUM 8.8 8.5 8.0* 8.2*   Liver Function Tests:  Recent Labs Lab 03/25/2014 0933  AST 33  ALT 23  ALKPHOS 56  BILITOT 1.6*  PROT 7.0  ALBUMIN 3.9   No results found for this basename: LIPASE, AMYLASE,  in the last 168 hours No results found for this basename: AMMONIA,  in the last 168 hours CBC:  Recent Labs Lab 03/13/2014 0933 04/01/14 0347 04/03/14 0632  WBC 17.0* 14.0* 8.9  NEUTROABS 15.5*  --   --   HGB 13.5 13.1 12.5*  HCT 41.1 40.0 38.6*  MCV 91.1 91.1 92.6  PLT 195 177 221   Cardiac Enzymes:  Recent Labs Lab 03/25/2014 0933 04/08/2014 1522 03/25/2014 2058 04/01/14 0347  TROPONINI <0.30 <0.30 <0.30 <0.30   BNP (last 3 results)  Recent Labs  04/02/2014 1522  PROBNP 1416.0*   CBG:  Recent Labs Lab 04/04/14 1824 04/04/14 2206 04/05/14 0807 04/05/14 1155 04/05/14 1726  GLUCAP 159* 100* 101* 144* 148*    No results found for this or any previous visit (from the past 240 hour(s)).   Studies: Dg Chest Port 1 View  04/04/2014   CLINICAL DATA:  Followup  pneumonia.  COPD exacerbation.  EXAM: PORTABLE CHEST - 1 VIEW  COMPARISON:  Chest x-ray 03/28/2014, 03/21/2014. Two-view chest x-ray 03/07/2013, 07/07/2010.  FINDINGS: Interval resolution of the patchy airspace opacities in the right upper lobe since the examination 4 days ago. No new pulmonary parenchymal abnormalities. Changes of COPD with marked hyperinflation, emphysematous changes and prominent bronchovascular markings diffusely, unchanged. Pleuroparenchymal scarring at the bases, unchanged.  Prior sternotomy for CABG. Cardiac silhouette normal in size. Thoracic aorta tortuous and atherosclerotic, unchanged. Hilar and mediastinal contours otherwise unremarkable. Surgical clips in the upper mediastinum on the left related to the prior CABG.  IMPRESSION: Resolution of right upper lobe pneumonia. No acute cardiopulmonary disease currently. Severe COPD/emphysema.   Electronically Signed   By: Hulan Saas M.D.   On: 04/04/2014 08:05    Scheduled Meds: . aspirin EC  81 mg Oral Daily  . budesonide-formoterol  2 puff Inhalation BID  . diltiazem  120 mg Oral Daily  . feeding supplement (ENSURE COMPLETE)  237 mL Oral BID BM  . [START ON 04/06/2014] furosemide  40 mg Intravenous BID  . guaiFENesin  600 mg Oral BID  . heparin  5,000 Units Subcutaneous 3 times per day  . insulin aspart  0-15 Units Subcutaneous TID WC  . insulin aspart  0-5 Units Subcutaneous QHS  . insulin detemir  5 Units Subcutaneous BID  . ipratropium  0.5 mg Nebulization QID  . isosorbide mononitrate  120 mg Oral Daily  . levalbuterol  0.63 mg Nebulization QID  . levofloxacin  750 mg Oral Daily  . meloxicam  15 mg Oral Daily  . methylPREDNISolone (SOLU-MEDROL) injection  40 mg Intravenous 4 times per day  . milk and molasses  1 enema Rectal Once  . pantoprazole  40 mg Oral Daily  . senna-docusate  1 tablet Oral QHS  . simvastatin  40 mg Oral QHS  . tamsulosin  0.4 mg Oral QHS   Continuous Infusions:   Assessment and  plan:  Principal Problem:   CAP (community acquired pneumonia) Active Problems:   HYPERTENSION   Coronary atherosclerosis of artery bypass graft   COPD with exacerbation   Pleuritic chest pain   Chronic respiratory failure with hypoxia   Generalized anxiety disorder   Urinary retention   Hyperglycemia, drug-induced   1. Community-acquired pneumonia. We'll continue Levaquin, oxygen, Mucinex, and supportive treatment. His followup chest x-ray on 04/04/14 reveals resolution of right upper lung opacities. We'll continue antibiotics empirically for total of 7 days. Oxygen-dependent COPD with exacerbation. He has globally decreased breath sounds bilaterally and upper airway wheezes and crackles as the patient has difficulty expectorating. We'll continue oxygen, IV steroids, nebulizers, Symbicort, Mucinex and Levaquin, flutter valve. Anticipate continued slow recovery. Chronic respiratory failure with chronic hypoxia and hypercapnia. Patient's ABG on 3 L of oxygen following admission revealed a pH of 7.38, PCO2 of 58, PO2 of 101. This is likely his baseline. Pleuritic chest  pain in a patient with CAD. Likely secondary to pneumonia and increase in work of breathing. His troponin I was negative x3. We'll continue analgesics as needed. Generalized anxiety disorder. He was recently weaned off of diazepam 10 mg 4 times daily. Diazepam ordered per his request because of anxiousness and titrated to 5 mg from 2.5 mg  every 6 hours as needed. TSH is within normal limits. Urinary hesitancy/retention. Foley catheter was placed in the ED. Flomax was started. UA negative for wbcs. Would do a voiding trial when he is clinically improved and approximately 24 hours before anticipated discharge.. Steroid-induced hyperglycemia. We'll continue sliding scale NovoLog and Levemir. Hemoglobin A1c is 5.6. Constipation. When necessary Dulcolax suppository and laxative therapy started. Given milk and molasses  enema   Disposition: needs SNF placement.   Time spent: 25 minutes    [facility]  [facility] Pager [phone]. If 7PM-7AM, please contact night-coverage at [email or web address], password [facility] 04/05/2014, 8:31 PM  LOS: 5 days

## 2014-04-05 NOTE — Progress Notes (Signed)
UR review complete.  

## 2014-04-05 NOTE — Clinical Social Work Psychosocial (Signed)
Clinical Social Work Department BRIEF PSYCHOSOCIAL ASSESSMENT 04/05/2014  Patient:  [name],[name]     Account Number:  [id number]     Admit date:  04/10/2014  Clinical Social Worker:  Wyatt Haste  Date/Time:  04/05/2014 11:38 AM  Referred by:  CSW  Date Referred:  04/05/2014 Referred for  SNF Placement   Other Referral:   Interview type:  Patient Other interview type:    PSYCHOSOCIAL DATA Living Status:  ALONE Admitted from facility:   Level of care:   Primary support name:  Cathy Primary support relationship to patient:  SIBLING Degree of support available:   supportive per pt    CURRENT CONCERNS Current Concerns  Post-Acute Placement   Other Concerns:    SOCIAL WORK ASSESSMENT / PLAN CSW met with pt at bedside. Pt alert and oriented and reports he lives alone. He came to ED about 2 weeks ago due to his feet swelling and returned home. Returned to ED with chest pain and weakness last week and was admitted with pneumonia. Pt's sister [name] is his best support. She lives about 10 miles away and checks on pt often. [name] also does pt's grocery shopping as he is unable to ambulate long distances. Pt is on chronic oxygen at 3 liters. At baseline, pt reports he is completely independent and still drives. He prepares simple meals including sandwiches, canned food, and microwaveable meals. PT evaluated pt today and recommendation is for SNF. CSW discussed SNF with pt and he admits that he cannot manage at home in current condition. Aware of insurance authorization process. He is open to considering SNF in [place] and will discuss with his sister later today. CSW will initiate [employer] authorization.   Assessment/plan status:  Psychosocial Support/Ongoing Assessment of Needs Other assessment/ plan:   Information/referral to community resources:   SNF list    PATIENT'S/FAMILY'S RESPONSE TO PLAN OF CARE: Pt agreeable to consider SNF at d/c. CSW will follow up tomorrow.        Benay Pike, [place]

## 2014-04-05 NOTE — Clinical Social Work Placement (Signed)
Clinical Social Work Department CLINICAL SOCIAL WORK PLACEMENT NOTE 04/05/2014  Patient:  [name], [name]  Account Number:  [id number] Admit date:  2014/04/25  Clinical Social Worker:  Derenda Fennel, LCSW  Date/time:  04/05/2014 11:34 AM  Clinical Social Work is seeking post-discharge placement for this patient at the following level of care:   SKILLED NURSING   (*CSW will update this form in Epic as items are completed)   04/05/2014  Patient/family provided with [facility] Department of Clinical Social Work's list of facilities offering this level of care within the geographic area requested by the patient (or if unable, by the patient's family).  04/05/2014  Patient/family informed of their freedom to choose among providers that offer the needed level of care, that participate in Medicare, Medicaid or managed care program needed by the patient, have an available bed and are willing to accept the patient.  04/05/2014  Patient/family informed of [facility]' ownership interest in [facility], as well as of the fact that they are under no obligation to receive care at this facility.  PASARR submitted to EDS on 04/05/2014 PASARR number received on 04/05/2014  FL2 transmitted to all facilities in geographic area requested by pt/family on  04/05/2014 FL2 transmitted to all facilities within larger geographic area on   Patient informed that his/her managed care company has contracts with or will negotiate with  certain facilities, including the following:     Patient/family informed of bed offers received:   Patient chooses bed at  Physician recommends and patient chooses bed at    Patient to be transferred to  on   Patient to be transferred to facility by  Patient and family notified of transfer on  Name of family member notified:    The following physician request were entered in Epic:   Additional Comments:  Derenda Fennel, LCSW [phone]

## 2014-04-05 NOTE — Evaluation (Addendum)
Physical Therapy Evaluation Patient Details Name: WILLIOM CEDAR MRN: [id number] DOB: [birth year] Today's Date: 04/05/2014   History of Present Illness  CORDNEY BARSTOW is a 68 y.o. male  with a history of coronary artery disease, status post CABG, oxygen-dependent COPD, steroid-induced hyperglycemia (the patient denies history of diabetes) and chronic anxiety, who presents to the emergency department today with a chief complaint of chest pain and shortness of breath. Both symptoms started approximately one week ago, but have become progressively worse over the last 2-3 days. His pain is located centrally and to the left and is associated with coughing, wheezing, and pleurisy. He denies the pain as a tightness. He denies associated radiation, nausea, or diaphoresis. He has shortness of breath chronically, but he has been more short of breath over the past 2-3 days, particularly with any activity. He denies outright orthopnea. He has had more wheezing. He has had a cough with "yucky" colored sputum. He has been using his albuterol nebulizers 4-5 times a day with no improvement in his symptoms. Last week he had lower extremity swelling of both legs and presented to the ED. He was treated and discharged on Lasix on 03/21/14. He reports no further bilateral leg swelling. He denies associated fever or chills.  He also reports some difficulty urinating and a Foley catheter was inserted in the ED. He has had urinary hesitancy for some time now, but has been able to urinate some.  Clinical Impression  Pt is a 68 year old male who presents to PT with dx of CAP.  Pt is chronically on O2 at 3 1/2 L, though reports increased SOB/fatigue with activity greater than normal today.  Pt was mod (I) with bed mobility skills and min guard and use of RW for transfers, though required seated rest break after all activies secondary to SOB/fatigue/dizziness.  Pt only able to amb 3 feet forward with use of RW to sit in the chair; unable  to tolerate further distance or a second attempt at amb secondary to low activity tolerance/SOB/fatigue with activity. Recommend continued PT while in the hospital to address strengthening, activity tolerance for improved functional mobility.  Discussed possible need for placement in SNF to continued rehab due to generalized weakness and low activity tolerance until pt safe to return home.  No DME recommendations at this time, will defer to SNF.     Follow Up Recommendations SNF    Equipment Recommendations  Other (comment) (Will defer to SNF. )       Precautions / Restrictions Precautions Precautions: Fall Restrictions Weight Bearing Restrictions: No      Mobility  Bed Mobility Overal bed mobility: Modified Independent                Transfers Overall transfer level: Needs assistance Equipment used: Rolling walker (2 wheeled) Transfers: Sit to/from [employer] Sit to Stand: Min guard Stand pivot transfers: Min guard       General transfer comment: Increased complaints of SOB/dizziness after transfer to standing.  VC for deep breathing (in through nose, out through mouth) to decrease dizziness.    Ambulation/Gait Ambulation/Gait assistance: Min guard Ambulation Distance (Feet): 3 Feet Assistive device: Rolling walker (2 wheeled) Gait Pattern/deviations: Step-through pattern;Decreased dorsiflexion - right;Decreased dorsiflexion - left   Gait velocity interpretation: Below normal speed for age/gender General Gait Details: Gait distance limited secondary to SOB/fatigue with activity.      Balance Overall balance assessment: Needs assistance Sitting-balance support: Feet supported;Single extremity supported Sitting balance-Leahy  Scale: Good     Standing balance support: Bilateral upper extremity supported;During functional activity Standing balance-Leahy Scale: Fair                               Pertinent Vitals/Pain Pain Assessment:  No/denies pain    Home Living Family/patient expects to be discharged to:: Unsure Living Arrangements: Alone Available Help at Discharge: Family;Friend(s);Available PRN/intermittently Type of Home: House Home Access: Stairs to enter Entrance Stairs-Rails: Left Entrance Stairs-Number of Steps: 4-5 to enter front door Home Layout: Laundry or work area in basement ((B) handrails to basement) Home Equipment: Environmental consultant - 2 wheels;Grab bars - tub/shower;Grab bars - toilet Additional Comments: Tub shower    Prior Function Level of Independence: Independent with assistive device(s)         Comments: Pt reports he is mod (I) with bed mobility skills, transers, and household amb with use of RW or furniture walking     Hand Dominance        Extremity/Trunk Assessment               Lower Extremity Assessment: Generalized weakness         Communication   Communication: No difficulties  Cognition Arousal/Alertness: Awake/alert Behavior During Therapy: WFL for tasks assessed/performed Overall Cognitive Status: Within Functional Limits for tasks assessed                        Assessment/Plan    PT Assessment Patient needs continued PT services  PT Diagnosis Difficulty walking;Generalized weakness   PT Problem List Decreased strength;Decreased activity tolerance;Decreased balance;Decreased mobility;Cardiopulmonary status limiting activity  PT Treatment Interventions Balance training;Gait training;Functional mobility training;Therapeutic activities;Therapeutic exercise;Patient/family education;Neuromuscular re-education   PT Goals (Current goals can be found in the Care Plan section) Acute Rehab PT Goals Patient Stated Goal: go home PT Goal Formulation: With patient Time For Goal Achievement: 04/19/14 Potential to Achieve Goals: Fair    Frequency Min 3X/week   Barriers to discharge Decreased caregiver support      Co-evaluation               End of  Session Equipment Utilized During Treatment: Gait belt;Oxygen Activity Tolerance: Patient limited by fatigue;Other (comment) (Limited by SOB) Patient left: in chair;with call bell/phone within reach;with chair alarm set           Time: 1001-1017 PT Time Calculation (min): 16 min   Charges:   PT Evaluation $Initial PT Evaluation Tier I: 1 Procedure     Kester Stimpson 04/05/2014, 12:34 PM

## 2014-04-06 DIAGNOSIS — R339 Retention of urine, unspecified: Secondary | ICD-10-CM

## 2014-04-06 LAB — GLUCOSE, CAPILLARY
GLUCOSE-CAPILLARY: 141 mg/dL — AB (ref 70–99)
Glucose-Capillary: 133 mg/dL — ABNORMAL HIGH (ref 70–99)
Glucose-Capillary: 98 mg/dL (ref 70–99)
Glucose-Capillary: 181 mg/dL — ABNORMAL HIGH (ref 70–99)
Glucose-Capillary: 166 mg/dL — ABNORMAL HIGH (ref 70–99)
Glucose-Capillary: 134 mg/dL — ABNORMAL HIGH (ref 70–99)

## 2014-04-06 LAB — BASIC METABOLIC PANEL
ANION GAP: 5 (ref 5–15)
BUN: 36 mg/dL — ABNORMAL HIGH (ref 6–23)
CHLORIDE: 98 meq/L (ref 96–112)
CO2: 36 mEq/L — ABNORMAL HIGH (ref 19–32)
CREATININE: 0.85 mg/dL (ref 0.50–1.35)
Calcium: 8.3 mg/dL — ABNORMAL LOW (ref 8.4–10.5)
GFR calc non Af Amer: 87 mL/min — ABNORMAL LOW (ref >90)
Glucose, Bld: 114 mg/dL — ABNORMAL HIGH (ref 70–99)
Potassium: 5.1 mEq/L (ref 3.7–5.3)
Sodium: 139 mEq/L (ref 137–147)

## 2014-04-06 MED ORDER — DILTIAZEM HCL 60 MG PO TABS
60.0000 mg | ORAL_TABLET | Freq: Once | ORAL | Status: AC
  Administered 2014-04-06: 60 mg via ORAL
  Filled 2014-04-06: qty 1

## 2014-04-06 MED ORDER — METHYLPREDNISOLONE SODIUM SUCC 125 MG IJ SOLR
80.0000 mg | Freq: Four times a day (QID) | INTRAMUSCULAR | Status: DC
  Administered 2014-04-06 – 2014-04-07 (×4): 80 mg via INTRAVENOUS
  Administered 2014-04-07: 05:00:00 via INTRAVENOUS
  Administered 2014-04-07 – 2014-04-10 (×12): 80 mg via INTRAVENOUS
  Filled 2014-04-06 (×18): qty 2

## 2014-04-06 MED ORDER — DILTIAZEM HCL ER COATED BEADS 180 MG PO CP24
180.0000 mg | ORAL_CAPSULE | Freq: Every day | ORAL | Status: DC
  Administered 2014-04-07 – 2014-04-10 (×4): 180 mg via ORAL
  Filled 2014-04-06 (×5): qty 1

## 2014-04-06 MED ORDER — MOMETASONE FURO-FORMOTEROL FUM 200-5 MCG/ACT IN AERO
2.0000 | INHALATION_SPRAY | Freq: Two times a day (BID) | RESPIRATORY_TRACT | Status: DC
  Administered 2014-04-06 – 2014-04-11 (×9): 2 via RESPIRATORY_TRACT
  Filled 2014-04-06: qty 8.8

## 2014-04-06 MED ORDER — IPRATROPIUM-ALBUTEROL 0.5-2.5 (3) MG/3ML IN SOLN
3.0000 mL | Freq: Four times a day (QID) | RESPIRATORY_TRACT | Status: DC
  Administered 2014-04-06 – 2014-04-12 (×22): 3 mL via RESPIRATORY_TRACT
  Filled 2014-04-06 (×25): qty 3

## 2014-04-06 NOTE — Clinical Social Work Note (Signed)
CSW presented bed offers to pt and he chooses [facility]. Facility notified. [facility] Medicare authorization received. If pt is not ready for d/c by tomorrow, CSW will resubmit. Updated PNC on pt.   Derenda Fennel, [place] [phone]

## 2014-04-06 NOTE — Progress Notes (Signed)
[facility] PROGRESS NOTE  [name] YQM:[id number] DOB: 1946-08-01 DOA: Apr 10, 2014 PCP: Colette Ribas, MD    Code Status:  DO NOT RESUSCITATE Family Communication: Family not available; discussed with patient Disposition Plan: Discharge when clinically appropriate    Consultants:  Pulmonology  Procedures:  None  Antibiotics:  Levaquin 8/21>>  Brief history: The patient is a 68 year old man with a history significant for severe oxygen-dependent COPD and coronary artery disease-status post CABG who presented to the emergency department on 04/10/2014 with a complaint of chest pain and shortness of breath. In the ED, his chest x-ray revealed right upper lobe opacities, worrisome for an inflammatory process. His ABG on 3 L of oxygen revealed a pH of 7.38, PCO2 of 58, and PO2 of 101. He had difficulty urinating and a Foley catheter was placed in the ED. Flomax was added empirically. His urinalysis revealed no sign of infection. His cardiac enzymes have been negative. He is being treated with Xopenex and Atrovent nebulizers, IV>>po Levaquin IV Solu-Medrol. Valium was restarted and titrated up to 5 mg every 6 hours as needed for anxiousness. He is slow to recover and will likely need several more days of hospitalization. We'll keep the Foley catheter in for now and would start a voiding trial at approximately 24 hours before the anticipated discharge.... which will likely be toward the end of this week.     Subjective: Feels worse today.  Having increasing difficulty breathing.  Objective: Filed Vitals:   04/06/14 1458  BP: 132/81  Pulse: 111  Temp: 98.1 F (36.7 C)  Resp: 16    Intake/Output Summary (Last 24 hours) at 04/06/14 1929 Last data filed at 04/06/14 1700  Gross per 24 hour  Intake    360 ml  Output   2950 ml  Net  -2590 ml   Filed Weights   04/04/14 0606 04/05/14 0458 04/06/14 0555  Weight: 58.242 kg (128 lb 6.4 oz) 63.4 kg (139 lb 12.4 oz) 63.7  kg (140 lb 6.9 oz)    Exam:   General:  Small framed frail 68 year-old man sitting in chair with chronic dyspnea.  Cardiovascular: S1, S2, with tachycardia. 1+ pedal edema.  Respiratory: diminished breath sounds bilaterally. Chronic pursed lip breathing; barrel chest.  Abdomen: Positive bowel sounds, soft, nontender, nondistended.  Musculoskeletal: No acute hot red joints.  Neuropsychiatric: He is alert and oriented x3. Mild tremor diffusely. Less anxiousness. His speech is clear. Cranial nerves II through XII are intact.   Data Reviewed: Basic Metabolic Panel:  Recent Labs Lab 04-10-14 0933 04/01/14 0347 04/03/14 0632 04/04/14 0620 04/06/14 0658  NA 141 136* 138 140 139  K 3.8 4.1 5.1 5.2 5.1  CL 95* 93* 98 99 98  CO2 34* 32 34* 34* 36*  GLUCOSE 117* 176* 128* 108* 114*  BUN 19 21 29* 28* 36*  CREATININE 1.05 0.97 0.92 0.87 0.85  CALCIUM 8.8 8.5 8.0* 8.2* 8.3*   Liver Function Tests:  Recent Labs Lab 2014/04/10 0933  AST 33  ALT 23  ALKPHOS 56  BILITOT 1.6*  PROT 7.0  ALBUMIN 3.9   No results found for this basename: LIPASE, AMYLASE,  in the last 168 hours No results found for this basename: AMMONIA,  in the last 168 hours CBC:  Recent Labs Lab 04/10/2014 0933 04/01/14 0347 04/03/14 0632  WBC 17.0* 14.0* 8.9  NEUTROABS 15.5*  --   --   HGB 13.5 13.1 12.5*  HCT 41.1 40.0 38.6*  MCV 91.1 91.1  92.6  PLT 195 177 221   Cardiac Enzymes:  Recent Labs Lab 03/16/2014 0933 03/17/2014 1522 03/11/2014 2058 04/01/14 0347  TROPONINI <0.30 <0.30 <0.30 <0.30   BNP (last 3 results)  Recent Labs  03/30/2014 1522  PROBNP 1416.0*   CBG:  Recent Labs Lab 04/05/14 2205 04/06/14 0748 04/06/14 0942 04/06/14 1138 04/06/14 1656  GLUCAP 133* 98 141* 181* 166*    No results found for this or any previous visit (from the past 240 hour(s)).   Studies: No results found.  Scheduled Meds: . aspirin EC  81 mg Oral Daily  . [START ON 04/07/2014] diltiazem  180 mg  Oral Daily  . feeding supplement (ENSURE COMPLETE)  237 mL Oral BID BM  . furosemide  40 mg Intravenous BID  . guaiFENesin  600 mg Oral BID  . heparin  5,000 Units Subcutaneous 3 times per day  . insulin aspart  0-15 Units Subcutaneous TID WC  . insulin aspart  0-5 Units Subcutaneous QHS  . insulin detemir  5 Units Subcutaneous BID  . ipratropium-albuterol  3 mL Nebulization QID  . isosorbide mononitrate  120 mg Oral Daily  . levofloxacin  750 mg Oral Daily  . meloxicam  15 mg Oral Daily  . methylPREDNISolone (SOLU-MEDROL) injection  80 mg Intravenous 4 times per day  . mometasone-formoterol  2 puff Inhalation BID  . pantoprazole  40 mg Oral Daily  . senna-docusate  1 tablet Oral QHS  . simvastatin  40 mg Oral QHS  . tamsulosin  0.4 mg Oral QHS   Continuous Infusions:   Assessment and plan:  Principal Problem:   CAP (community acquired pneumonia) Active Problems:   HYPERTENSION   Coronary atherosclerosis of artery bypass graft   COPD with exacerbation   Pleuritic chest pain   Chronic respiratory failure with hypoxia   Generalized anxiety disorder   Urinary retention   Hyperglycemia, drug-induced   1. Community-acquired pneumonia. We'll continue Levaquin, oxygen, Mucinex, and supportive treatment. His followup chest x-ray on 04/04/14 reveals resolution of right upper lung opacities. We'll continue antibiotics empirically for total of 7 days. Oxygen-dependent COPD with exacerbation. Patient has been slow to improve. He is on bronchodilators, inhaled steroids, intravenous steroids, antibiotics, mucolytics and flutter valve. Since he's having worsening shortness of breath, will increase dosing on IV steroids. Will request pulmonology consultation to assist in management. Anticipate continued slow recovery. Chronic respiratory failure with chronic hypoxia and hypercapnia. Patient's ABG on 3 L of oxygen following admission revealed a pH of 7.38, PCO2 of 58, PO2 of 101. This is  likely his baseline. Pleuritic chest pain in a patient with CAD. Likely secondary to pneumonia and increase in work of breathing. His troponin I was negative x3. We'll continue analgesics as needed. Generalized anxiety disorder. He was recently weaned off of diazepam 10 mg 4 times daily. Diazepam ordered per his request because of anxiousness and titrated to 5 mg from 2.5 mg  every 6 hours as needed. TSH is within normal limits. Urinary hesitancy/retention. Foley catheter was placed in the ED. Flomax was started. UA negative for wbcs. Would do a voiding trial when he is clinically improved and approximately 24 hours before anticipated discharge.. Steroid-induced hyperglycemia. We'll continue sliding scale NovoLog and Levemir. Hemoglobin A1c is 5.6. Constipation. When necessary Dulcolax suppository and laxative therapy started. Given milk and molasses enema   Disposition: needs SNF placement.   Time spent: 25 minutes    [facility]  [facility] Pager [phone]. If 7PM-7AM, please contact  night-coverage at [email or web address], password [facility] 04/06/2014, 7:29 PM  LOS: 6 days

## 2014-04-06 NOTE — Consult Note (Signed)
Pt seen and examined. He says  that xopenex always makes him worse so I switched him to duoneb.  Otherwise I agree with treatment. Full note to follow

## 2014-04-06 NOTE — Progress Notes (Signed)
Patient c/o feeling more short of breath this visit and unable to eat without getting short of breath. Patient stated he is feeling worse today than yesterday. Heart rate running between 115-125 and patient had a run of Vtach. Dr. Kerry Hough notified and will see patient.

## 2014-04-06 NOTE — Clinical Social Work Placement (Signed)
Clinical Social Work Department CLINICAL SOCIAL WORK PLACEMENT NOTE 04/06/2014  Patient:  [name], [name]  Account Number:  [id number] Admit date:  04/19/2014  Clinical Social Worker:  Derenda Fennel, LCSW  Date/time:  04/05/2014 11:34 AM  Clinical Social Work is seeking post-discharge placement for this patient at the following level of care:   SKILLED NURSING   (*CSW will update this form in Epic as items are completed)   04/05/2014  Patient/family provided with [facility] Department of Clinical Social Work's list of facilities offering this level of care within the geographic area requested by the patient (or if unable, by the patient's family).  04/05/2014  Patient/family informed of their freedom to choose among providers that offer the needed level of care, that participate in Medicare, Medicaid or managed care program needed by the patient, have an available bed and are willing to accept the patient.  04/05/2014  Patient/family informed of [facility]' ownership interest in [facility], as well as of the fact that they are under no obligation to receive care at this facility.  PASARR submitted to EDS on 04/05/2014 PASARR number received on 04/05/2014  FL2 transmitted to all facilities in geographic area requested by pt/family on  04/05/2014 FL2 transmitted to all facilities within larger geographic area on   Patient informed that his/her managed care company has contracts with or will negotiate with  certain facilities, including the following:     Patient/family informed of bed offers received:  04/06/2014 Patient chooses bed at [facility] Physician recommends and patient chooses bed at  [facility]  Patient to be transferred to  on   Patient to be transferred to facility by  Patient and family notified of transfer on  Name of family member notified:    The following physician request were entered in Epic:   Additional Comments:  Derenda Fennel,  LCSW [phone]

## 2014-04-07 LAB — CBC
HEMATOCRIT: 40.8 % (ref 39.0–52.0)
HEMOGLOBIN: 13.2 g/dL (ref 13.0–17.0)
MCH: 29.9 pg (ref 26.0–34.0)
MCHC: 32.4 g/dL (ref 30.0–36.0)
MCV: 92.5 fL (ref 78.0–100.0)
Platelets: 218 10*3/uL (ref 150–400)
RBC: 4.41 MIL/uL (ref 4.22–5.81)
RDW: 12.8 % (ref 11.5–15.5)
WBC: 6.7 10*3/uL (ref 4.0–10.5)

## 2014-04-07 LAB — GLUCOSE, CAPILLARY
GLUCOSE-CAPILLARY: 112 mg/dL — AB (ref 70–99)
GLUCOSE-CAPILLARY: 133 mg/dL — AB (ref 70–99)
GLUCOSE-CAPILLARY: 151 mg/dL — AB (ref 70–99)
GLUCOSE-CAPILLARY: 110 mg/dL — AB (ref 70–99)

## 2014-04-07 LAB — BASIC METABOLIC PANEL
Anion gap: 3 — ABNORMAL LOW (ref 5–15)
BUN: 53 mg/dL — AB (ref 6–23)
CHLORIDE: 95 meq/L — AB (ref 96–112)
CO2: 42 meq/L — AB (ref 19–32)
CREATININE: 1.12 mg/dL (ref 0.50–1.35)
Calcium: 8.2 mg/dL — ABNORMAL LOW (ref 8.4–10.5)
GFR calc Af Amer: 76 mL/min — ABNORMAL LOW (ref >90)
GFR calc non Af Amer: 65 mL/min — ABNORMAL LOW (ref >90)
GLUCOSE: 127 mg/dL — AB (ref 70–99)
POTASSIUM: 5 meq/L (ref 3.7–5.3)
Sodium: 140 mEq/L (ref 137–147)

## 2014-04-07 NOTE — Consult Note (Signed)
Consult requested by: [facility] Consult requested for COPD exacerbation:  HPI: This is a 68 year old who has a long known history of severe COPD. And he was admitted with increased shortness of breath and probable pneumonia. He continues to be very short of breath. At his best he is able to do only minimal activity at home. He says he thinks that the current medication with Xopenex making him worse rather than better.  Past Medical History  Diagnosis Date  . Hypertension   . On home oxygen therapy   . Coronary artery disease     s/p CABG 1996  . Diabetes mellitus   . CVA (cerebral infarction)     right sided requiring TPA in 2003  . BPH (benign prostatic hypertrophy)   . GERD (gastroesophageal reflux disease)   . CHRONIC OBSTRUCTIVE PULMONARY DISEASE 08/17/2007    PFTs 1/09 >> FEV1 0.64 (21%), ratio 22, DLCO 35%    . HYPOXEMIA HYPOXIA HYPOXIC 08/17/2007    Qualifier: Diagnosis of  By: Vassie Loll MD, Comer Locket    . Nodule of left lung 11/04/2010    Serial CT scans from '08 show inflammatory scarring     . Atrial fibrillation 07/16/2010    Qualifier: Diagnosis of  By: Daleen Squibb, MD, Lorenza Cambridge Coronary atherosclerosis of artery bypass graft 08/20/2009    Qualifier: Diagnosis of  By: Via LPN, [name]    . Generalized anxiety disorder      Family History  Problem Relation Age of Onset  . Heart failure Father      History   Social History  . Marital Status: Divorced    Spouse Name: N/A    Number of Children: 1  . Years of Education: N/A   Occupational History  . Disabled     Former Education officer, environmental   Social History Main Topics  . Smoking status: Former Smoker -- 2.00 packs/day for 40 years    Types: Cigarettes    Quit date: 07/12/2007  . Smokeless tobacco: Never Used  . Alcohol Use: No  . Drug Use: No  . Sexual Activity: None   Other Topics Concern  . None   Social History Narrative  . None     ROS: He has had some chest pain. No hemoptysis. His cough is  nonproductive.    Objective: Vital signs in last 24 hours: Temp:  [97.4 F (36.3 C)-98.1 F (36.7 C)] 97.4 F (36.3 C) (08/28 0500) Pulse Rate:  [96-115] 96 (08/28 0500) Resp:  [16-18] 18 (08/28 0500) BP: (100-132)/(60-81) 100/62 mmHg (08/28 0500) SpO2:  [95 %-97 %] 95 % (08/28 0708) Weight:  [63.8 kg (140 lb 10.5 oz)] 63.8 kg (140 lb 10.5 oz) (08/28 0500) Weight change: 0.1 kg (3.5 oz) Last BM Date: 04/05/14  Intake/Output from previous day: 08/27 0701 - 08/28 0700 In: 360 [P.O.:360] Out: 3200 [Urine:3200]  PHYSICAL EXAM He is awake and alert. He is on oxygen. He looks dyspneic at rest. His HEENT exam is unremarkable. His nose and throat clear his neck is supple without masses. His chest shows rhonchi and wheezes bilaterally. His heart is regular. His abdomen is soft. Central nervous system exam is grossly intact  Lab Results: Basic Metabolic Panel:  Recent Labs  81/19/14 0658 04/07/14 0551  NA 139 140  K 5.1 5.0  CL 98 95*  CO2 36* 42*  GLUCOSE 114* 127*  BUN 36* 53*  CREATININE 0.85 1.12  CALCIUM 8.3* 8.2*   Liver Function Tests: No results  found for this basename: AST, ALT, ALKPHOS, BILITOT, PROT, ALBUMIN,  in the last 72 hours No results found for this basename: LIPASE, AMYLASE,  in the last 72 hours No results found for this basename: AMMONIA,  in the last 72 hours CBC:  Recent Labs  04/07/14 0551  WBC 6.7  HGB 13.2  HCT 40.8  MCV 92.5  PLT 218   Cardiac Enzymes: No results found for this basename: CKTOTAL, CKMB, CKMBINDEX, TROPONINI,  in the last 72 hours BNP: No results found for this basename: PROBNP,  in the last 72 hours D-Dimer: No results found for this basename: DDIMER,  in the last 72 hours CBG:  Recent Labs  04/06/14 0748 04/06/14 0942 04/06/14 1138 04/06/14 1656 04/06/14 2036 04/07/14 0730  GLUCAP 98 141* 181* 166* 134* 112*   Hemoglobin A1C: No results found for this basename: HGBA1C,  in the last 72 hours Fasting Lipid  Panel: No results found for this basename: CHOL, HDL, LDLCALC, TRIG, CHOLHDL, LDLDIRECT,  in the last 72 hours Thyroid Function Tests: No results found for this basename: TSH, T4TOTAL, FREET4, T3FREE, THYROIDAB,  in the last 72 hours Anemia Panel: No results found for this basename: VITAMINB12, FOLATE, FERRITIN, TIBC, IRON, RETICCTPCT,  in the last 72 hours Coagulation: No results found for this basename: LABPROT, INR,  in the last 72 hours Urine Drug Screen: Drugs of Abuse  No results found for this basename: labopia, cocainscrnur, labbenz, amphetmu, thcu, labbarb    Alcohol Level: No results found for this basename: ETH,  in the last 72 hours Urinalysis: No results found for this basename: COLORURINE, APPERANCEUR, LABSPEC, PHURINE, GLUCOSEU, HGBUR, BILIRUBINUR, KETONESUR, PROTEINUR, UROBILINOGEN, NITRITE, LEUKOCYTESUR,  in the last 72 hours Misc. Labs:   ABGS: No results found for this basename: PHART, PCO2, PO2ART, TCO2, HCO3,  in the last 72 hours   MICROBIOLOGY: No results found for this or any previous visit (from the past 240 hour(s)).  Studies/Results: No results found.  Medications:  Prior to Admission:  Prescriptions prior to admission  Medication Sig Dispense Refill  . albuterol (PROVENTIL) (2.5 MG/3ML) 0.083% nebulizer solution Take 2.5 mg by nebulization 4 (four) times daily.       Marland Kitchen alendronate (FOSAMAX) 70 MG tablet Take 1 tablet by mouth once a week. Takes on Sundays.      Marland Kitchen aspirin EC 81 MG tablet Take 81 mg by mouth daily.      . budesonide-formoterol (SYMBICORT) 160-4.5 MCG/ACT inhaler Inhale 2 puffs into the lungs 2 (two) times daily.  3 Inhaler  1  . diltiazem (CARDIZEM CD) 120 MG 24 hr capsule Take 120 mg by mouth daily.       . furosemide (LASIX) 20 MG tablet Take 1 tablet (20 mg total) by mouth daily.  30 tablet  1  . ipratropium (ATROVENT) 0.02 % nebulizer solution Take 0.5 mg by nebulization 4 (four) times daily.       . isosorbide mononitrate (IMDUR)  60 MG 24 hr tablet Take 120 mg by mouth daily.      . meloxicam (MOBIC) 15 MG tablet Take 15 mg by mouth daily.      . nitroGLYCERIN (NITROSTAT) 0.4 MG SL tablet Place 1 tablet (0.4 mg total) under the tongue every 5 (five) minutes as needed for chest pain. Take one tablet under tongue at onset of chest pain: you may repeat every 5 minutes for up to 3 doses  90 tablet  0  . pantoprazole (PROTONIX) 40 MG tablet Take  one by mouth twice daily       . potassium chloride (K-DUR) 10 MEQ tablet Take 1 tablet (10 mEq total) by mouth daily.  30 tablet  1  . simvastatin (ZOCOR) 40 MG tablet Take 40 mg by mouth at bedtime.       Scheduled: . aspirin EC  81 mg Oral Daily  . diltiazem  180 mg Oral Daily  . feeding supplement (ENSURE COMPLETE)  237 mL Oral BID BM  . guaiFENesin  600 mg Oral BID  . heparin  5,000 Units Subcutaneous 3 times per day  . insulin aspart  0-15 Units Subcutaneous TID WC  . insulin aspart  0-5 Units Subcutaneous QHS  . insulin detemir  5 Units Subcutaneous BID  . ipratropium-albuterol  3 mL Nebulization QID  . isosorbide mononitrate  120 mg Oral Daily  . levofloxacin  750 mg Oral Daily  . meloxicam  15 mg Oral Daily  . methylPREDNISolone (SOLU-MEDROL) injection  80 mg Intravenous 4 times per day  . mometasone-formoterol  2 puff Inhalation BID  . pantoprazole  40 mg Oral Daily  . senna-docusate  1 tablet Oral QHS  . simvastatin  40 mg Oral QHS  . tamsulosin  0.4 mg Oral QHS   Continuous:  ZOX:[id number], acetaminophen, alum & mag hydroxide-simeth, diazepam, nitroGLYCERIN, ondansetron (ZOFRAN) IV, ondansetron, oxyCODONE  Assesment: He has community-acquired pneumonia and COPD exacerbation. He says he thinks his medication is giving him some trouble so I switched him from Xopenex back to albuterol. He has been having some elevated heart rate so this will need to be observed carefully Principal Problem:   CAP (community acquired pneumonia) Active Problems:   HYPERTENSION    Coronary atherosclerosis of artery bypass graft   COPD with exacerbation   Pleuritic chest pain   Chronic respiratory failure with hypoxia   Generalized anxiety disorder   Urinary retention   Hyperglycemia, drug-induced    Plan: Continue other treatments. Continue with  other medications.    LOS: 7 days   Kais Monje L 04/07/2014, 9:05 AM

## 2014-04-07 NOTE — Progress Notes (Signed)
[facility] PROGRESS NOTE  [name] WUJ:[id number] DOB: [birth year] DOA: 04/08/2014 PCP: Colette Ribas, MD    Code Status:  DO NOT RESUSCITATE Family Communication: Family not available; discussed with patient Disposition Plan: Discharge when clinically appropriate    Consultants:  Pulmonology  Procedures:  None  Antibiotics:  Levaquin 8/21>>8/28  Brief history: The patient is a 68 year old man with a history significant for severe oxygen-dependent COPD and coronary artery disease-status post CABG who presented to the emergency department on 03/27/2014 with a complaint of chest pain and shortness of breath. In the ED, his chest x-ray revealed right upper lobe opacities, worrisome for an inflammatory process. His ABG on 3 L of oxygen revealed a pH of 7.38, PCO2 of 58, and PO2 of 101. He had difficulty urinating and a Foley catheter was placed in the ED. Flomax was added empirically. His urinalysis revealed no sign of infection. His cardiac enzymes have been negative. He is being treated with Xopenex and Atrovent nebulizers, IV>>po Levaquin IV Solu-Medrol. Valium was restarted and titrated up to 5 mg every 6 hours as needed for anxiousness. He is slow to recover and will likely need several more days of hospitalization. We'll keep the Foley catheter in for now and would start a voiding trial at approximately 24 hours before the anticipated discharge.... which will likely be toward the end of this week.     Subjective: Feels about the same. Still having difficulty breathing. Cough is nonproductive  Objective: Filed Vitals:   04/07/14 1421  BP: 115/51  Pulse: 106  Temp: 98 F (36.7 C)  Resp: 20    Intake/Output Summary (Last 24 hours) at 04/07/14 2012 Last data filed at 04/07/14 1834  Gross per 24 hour  Intake    360 ml  Output   2000 ml  Net  -1640 ml   Filed Weights   04/05/14 0458 04/06/14 0555 04/07/14 0500  Weight: 63.4 kg (139 lb 12.4 oz) 63.7 kg  (140 lb 6.9 oz) 63.8 kg (140 lb 10.5 oz)    Exam:   General:  Small framed frail 68 year-old man sitting in bed with chronic dyspnea.  Cardiovascular: S1, S2, with tachycardia. 1+ pedal edema.  Respiratory: diminished breath sounds bilaterally with rhonchi bilaterally. Chronic pursed lip breathing; barrel chest.  Abdomen: Positive bowel sounds, soft, nontender, nondistended.  Musculoskeletal: No acute hot red joints.  Neuropsychiatric: He is alert and oriented x3. Mild tremor diffusely. Less anxiousness. His speech is clear. Cranial nerves II through XII are intact.   Data Reviewed: Basic Metabolic Panel:  Recent Labs Lab 04/01/14 0347 04/03/14 9562 04/04/14 0620 04/06/14 0658 04/07/14 0551  NA 136* 138 140 139 140  K 4.1 5.1 5.2 5.1 5.0  CL 93* 98 99 98 95*  CO2 32 34* 34* 36* 42*  GLUCOSE 176* 128* 108* 114* 127*  BUN 21 29* 28* 36* 53*  CREATININE 0.97 0.92 0.87 0.85 1.12  CALCIUM 8.5 8.0* 8.2* 8.3* 8.2*   Liver Function Tests: No results found for this basename: AST, ALT, ALKPHOS, BILITOT, PROT, ALBUMIN,  in the last 168 hours No results found for this basename: LIPASE, AMYLASE,  in the last 168 hours No results found for this basename: AMMONIA,  in the last 168 hours CBC:  Recent Labs Lab 04/01/14 0347 04/03/14 0632 04/07/14 0551  WBC 14.0* 8.9 6.7  HGB 13.1 12.5* 13.2  HCT 40.0 38.6* 40.8  MCV 91.1 92.6 92.5  PLT 177 221 218   Cardiac Enzymes:  Recent Labs Lab April 24, 2014 2058 04/01/14 0347  TROPONINI <0.30 <0.30   BNP (last 3 results)  Recent Labs  04/24/14 1522  PROBNP 1416.0*   CBG:  Recent Labs Lab 04/06/14 1656 04/06/14 2036 04/07/14 0730 04/07/14 1103 04/07/14 1612  GLUCAP 166* 134* 112* 133* 151*    No results found for this or any previous visit (from the past 240 hour(s)).   Studies: No results found.  Scheduled Meds: . aspirin EC  81 mg Oral Daily  . diltiazem  180 mg Oral Daily  . feeding supplement (ENSURE  COMPLETE)  237 mL Oral BID BM  . guaiFENesin  600 mg Oral BID  . heparin  5,000 Units Subcutaneous 3 times per day  . insulin aspart  0-15 Units Subcutaneous TID WC  . insulin aspart  0-5 Units Subcutaneous QHS  . insulin detemir  5 Units Subcutaneous BID  . ipratropium-albuterol  3 mL Nebulization QID  . isosorbide mononitrate  120 mg Oral Daily  . levofloxacin  750 mg Oral Daily  . meloxicam  15 mg Oral Daily  . methylPREDNISolone (SOLU-MEDROL) injection  80 mg Intravenous 4 times per day  . mometasone-formoterol  2 puff Inhalation BID  . pantoprazole  40 mg Oral Daily  . senna-docusate  1 tablet Oral QHS  . simvastatin  40 mg Oral QHS  . tamsulosin  0.4 mg Oral QHS   Continuous Infusions:   Assessment and plan:  Principal Problem:   CAP (community acquired pneumonia) Active Problems:   HYPERTENSION   Coronary atherosclerosis of artery bypass graft   COPD with exacerbation   Pleuritic chest pain   Chronic respiratory failure with hypoxia   Generalized anxiety disorder   Urinary retention   Hyperglycemia, drug-induced   1. Community-acquired pneumonia. He has completed a course of Levaquin. He is now afebrile. We'll continue oxygen, Mucinex, and supportive treatment. His followup chest x-ray on 04/04/14 reveals resolution of right upper lung opacities.  Oxygen-dependent COPD with exacerbation. Patient has been slow to improve. He is on bronchodilators, inhaled steroids, intravenous steroids, mucolytics and flutter valve. IV steroid dosing was increased yesterday. Appreciate pulmonology assistance. Anticipate continued slow recovery. Chronic respiratory failure with chronic hypoxia and hypercapnia. Patient's ABG on 3 L of oxygen following admission revealed a pH of 7.38, PCO2 of 58, PO2 of 101. This is likely his baseline. Pleuritic chest pain in a patient with CAD. Likely secondary to pneumonia and increase in work of breathing. His troponin I was negative x3. We'll continue  analgesics as needed. Generalized anxiety disorder. He was recently weaned off of diazepam 10 mg 4 times daily. Diazepam ordered per his request because of anxiousness and titrated to 5 mg from 2.5 mg  every 6 hours as needed. TSH is within normal limits. Urinary hesitancy/retention. Foley catheter was placed in the ED. Flomax was started. UA negative for wbcs. Would do a voiding trial when he is clinically improved and approximately 24 hours before anticipated discharge.. Steroid-induced hyperglycemia. We'll continue sliding scale NovoLog and Levemir. Hemoglobin A1c is 5.6. Constipation. When necessary Dulcolax suppository and laxative therapy started. Given milk and molasses enema Lower extremity edema. Possibly related to venous stasis/right-sided heart failure from pulmonary hypertension. He's been started on intravenous Lasix and has had increasing BUN/contraction alkalosis. We'll hold off on further diuretics for now   Disposition: needs SNF placement.   Time spent: 25 minutes    [facility]  [facility] Pager [phone]. If 7PM-7AM, please contact night-coverage at [email or web address], password [facility] 04/07/2014,  8:12 PM  LOS: 7 days

## 2014-04-07 NOTE — Progress Notes (Signed)
Subjective: He says he feels a little better. He has no new complaints. His breathing is about the same  Objective: Vital signs in last 24 hours: Temp:  [97.4 F (36.3 C)-98.1 F (36.7 C)] 97.4 F (36.3 C) (08/28 0500) Pulse Rate:  [96-115] 96 (08/28 0500) Resp:  [16-18] 18 (08/28 0500) BP: (100-132)/(60-81) 100/62 mmHg (08/28 0500) SpO2:  [95 %-97 %] 95 % (08/28 0708) Weight:  [63.8 kg (140 lb 10.5 oz)] 63.8 kg (140 lb 10.5 oz) (08/28 0500) Weight change: 0.1 kg (3.5 oz) Last BM Date: 04/05/14  Intake/Output from previous day: 08/27 0701 - 08/28 0700 In: 360 [P.O.:360] Out: 3200 [Urine:3200]  PHYSICAL EXAM General appearance: alert, cooperative and moderate distress Resp: rhonchi bilaterally Cardio: Irregularly irregular without gallop GI: soft, non-tender; bowel sounds normal; no masses,  no organomegaly Extremities: extremities normal, atraumatic, no cyanosis or edema  Lab Results:  Results for orders placed during the hospital encounter of 03/29/2014 (from the past 48 hour(s))  GLUCOSE, CAPILLARY     Status: Abnormal   Collection Time    04/05/14 11:55 AM      Result Value Ref Range   Glucose-Capillary 144 (*) 70 - 99 mg/dL   Comment 1 Notify RN    GLUCOSE, CAPILLARY     Status: Abnormal   Collection Time    04/05/14  5:26 PM      Result Value Ref Range   Glucose-Capillary 148 (*) 70 - 99 mg/dL   Comment 1 Notify RN    GLUCOSE, CAPILLARY     Status: Abnormal   Collection Time    04/05/14 10:05 PM      Result Value Ref Range   Glucose-Capillary 133 (*) 70 - 99 mg/dL  BASIC METABOLIC PANEL     Status: Abnormal   Collection Time    04/06/14  6:58 AM      Result Value Ref Range   Sodium 139  137 - 147 mEq/L   Potassium 5.1  3.7 - 5.3 mEq/L   Chloride 98  96 - 112 mEq/L   CO2 36 (*) 19 - 32 mEq/L   Glucose, Bld 114 (*) 70 - 99 mg/dL   BUN 36 (*) 6 - 23 mg/dL   Creatinine, Ser 0.85  0.50 - 1.35 mg/dL   Calcium 8.3 (*) 8.4 - 10.5 mg/dL   GFR calc non Af Amer 87  (*) >90 mL/min   GFR calc Af Amer >90  >90 mL/min   Comment: (NOTE)     The eGFR has been calculated using the CKD EPI equation.     This calculation has not been validated in all clinical situations.     eGFR's persistently <90 mL/min signify possible Chronic Kidney     Disease.   Anion gap 5  5 - 15  GLUCOSE, CAPILLARY     Status: None   Collection Time    04/06/14  7:48 AM      Result Value Ref Range   Glucose-Capillary 98  70 - 99 mg/dL  GLUCOSE, CAPILLARY     Status: Abnormal   Collection Time    04/06/14  9:42 AM      Result Value Ref Range   Glucose-Capillary 141 (*) 70 - 99 mg/dL  GLUCOSE, CAPILLARY     Status: Abnormal   Collection Time    04/06/14 11:38 AM      Result Value Ref Range   Glucose-Capillary 181 (*) 70 - 99 mg/dL  GLUCOSE, CAPILLARY  Status: Abnormal   Collection Time    04/06/14  4:56 PM      Result Value Ref Range   Glucose-Capillary 166 (*) 70 - 99 mg/dL  GLUCOSE, CAPILLARY     Status: Abnormal   Collection Time    04/06/14  8:36 PM      Result Value Ref Range   Glucose-Capillary 134 (*) 70 - 99 mg/dL   Comment 1 Notify RN     Comment 2 Documented in Chart    CBC     Status: None   Collection Time    04/07/14  5:51 AM      Result Value Ref Range   WBC 6.7  4.0 - 10.5 K/uL   RBC 4.41  4.22 - 5.81 MIL/uL   Hemoglobin 13.2  13.0 - 17.0 g/dL   HCT 40.8  39.0 - 52.0 %   MCV 92.5  78.0 - 100.0 fL   MCH 29.9  26.0 - 34.0 pg   MCHC 32.4  30.0 - 36.0 g/dL   RDW 12.8  11.5 - 15.5 %   Platelets 218  150 - 400 K/uL  BASIC METABOLIC PANEL     Status: Abnormal   Collection Time    04/07/14  5:51 AM      Result Value Ref Range   Sodium 140  137 - 147 mEq/L   Potassium 5.0  3.7 - 5.3 mEq/L   Chloride 95 (*) 96 - 112 mEq/L   CO2 42 (*) 19 - 32 mEq/L   Comment: CRITICAL RESULT CALLED TO, READ BACK BY AND VERIFIED WITH:     ROGERS,A AT 7:11AM ON 04/07/14 BY FESTERMAN,C   Glucose, Bld 127 (*) 70 - 99 mg/dL   BUN 53 (*) 6 - 23 mg/dL   Creatinine, Ser  1.12  0.50 - 1.35 mg/dL   Calcium 8.2 (*) 8.4 - 10.5 mg/dL   GFR calc non Af Amer 65 (*) >90 mL/min   GFR calc Af Amer 76 (*) >90 mL/min   Comment: (NOTE)     The eGFR has been calculated using the CKD EPI equation.     This calculation has not been validated in all clinical situations.     eGFR's persistently <90 mL/min signify possible Chronic Kidney     Disease.   Anion gap 3 (*) 5 - 15  GLUCOSE, CAPILLARY     Status: Abnormal   Collection Time    04/07/14  7:30 AM      Result Value Ref Range   Glucose-Capillary 112 (*) 70 - 99 mg/dL   Comment 1 Documented in Chart     Comment 2 Notify RN      ABGS No results found for this basename: PHART, PCO2, PO2ART, TCO2, HCO3,  in the last 72 hours CULTURES No results found for this or any previous visit (from the past 240 hour(s)). Studies/Results: No results found.  Medications:  Prior to Admission:  Prescriptions prior to admission  Medication Sig Dispense Refill  . albuterol (PROVENTIL) (2.5 MG/3ML) 0.083% nebulizer solution Take 2.5 mg by nebulization 4 (four) times daily.       Marland Kitchen alendronate (FOSAMAX) 70 MG tablet Take 1 tablet by mouth once a week. Takes on Sundays.      Marland Kitchen aspirin EC 81 MG tablet Take 81 mg by mouth daily.      . budesonide-formoterol (SYMBICORT) 160-4.5 MCG/ACT inhaler Inhale 2 puffs into the lungs 2 (two) times daily.  3 Inhaler  1  .  diltiazem (CARDIZEM CD) 120 MG 24 hr capsule Take 120 mg by mouth daily.       . furosemide (LASIX) 20 MG tablet Take 1 tablet (20 mg total) by mouth daily.  30 tablet  1  . ipratropium (ATROVENT) 0.02 % nebulizer solution Take 0.5 mg by nebulization 4 (four) times daily.       . isosorbide mononitrate (IMDUR) 60 MG 24 hr tablet Take 120 mg by mouth daily.      . meloxicam (MOBIC) 15 MG tablet Take 15 mg by mouth daily.      . nitroGLYCERIN (NITROSTAT) 0.4 MG SL tablet Place 1 tablet (0.4 mg total) under the tongue every 5 (five) minutes as needed for chest pain. Take one tablet  under tongue at onset of chest pain: you may repeat every 5 minutes for up to 3 doses  90 tablet  0  . pantoprazole (PROTONIX) 40 MG tablet Take one by mouth twice daily       . potassium chloride (K-DUR) 10 MEQ tablet Take 1 tablet (10 mEq total) by mouth daily.  30 tablet  1  . simvastatin (ZOCOR) 40 MG tablet Take 40 mg by mouth at bedtime.       Scheduled: . aspirin EC  81 mg Oral Daily  . diltiazem  180 mg Oral Daily  . feeding supplement (ENSURE COMPLETE)  237 mL Oral BID BM  . guaiFENesin  600 mg Oral BID  . heparin  5,000 Units Subcutaneous 3 times per day  . insulin aspart  0-15 Units Subcutaneous TID WC  . insulin aspart  0-5 Units Subcutaneous QHS  . insulin detemir  5 Units Subcutaneous BID  . ipratropium-albuterol  3 mL Nebulization QID  . isosorbide mononitrate  120 mg Oral Daily  . levofloxacin  750 mg Oral Daily  . meloxicam  15 mg Oral Daily  . methylPREDNISolone (SOLU-MEDROL) injection  80 mg Intravenous 4 times per day  . mometasone-formoterol  2 puff Inhalation BID  . pantoprazole  40 mg Oral Daily  . senna-docusate  1 tablet Oral QHS  . simvastatin  40 mg Oral QHS  . tamsulosin  0.4 mg Oral QHS   Continuous:  BTY:[id number], acetaminophen, alum & mag hydroxide-simeth, diazepam, nitroGLYCERIN, ondansetron (ZOFRAN) IV, ondansetron, oxyCODONE  Assesment: He was admitted with community-acquired pneumonia. He has severe COPD at baseline. He seems to be a little better. Principal Problem:   CAP (community acquired pneumonia) Active Problems:   HYPERTENSION   Coronary atherosclerosis of artery bypass graft   COPD with exacerbation   Pleuritic chest pain   Chronic respiratory failure with hypoxia   Generalized anxiety disorder   Urinary retention   Hyperglycemia, drug-induced    Plan: No change in treatments. Continue medications    LOS: 7 days   Woody Kronberg L 04/07/2014, 9:20 AM

## 2014-04-07 NOTE — Clinical Social Work Note (Signed)
CSW discussed pt with MD. Pt will need several more days in hospital per MD. Possible d/c Monday or early next week. CSW updated [employer] and [employer]. New authorization will need to be started on Monday.  Derenda Fennel, [place] [phone]

## 2014-04-07 NOTE — Plan of Care (Signed)
Problem: Phase I Progression Outcomes Goal: Voiding-avoid urinary catheter unless indicated Outcome: Not Progressing Urinary catheter for retention

## 2014-04-07 NOTE — Progress Notes (Signed)
Nutrition Brief Note  RD pulled to chart due to LOS.   Wt Readings from Last 15 Encounters:  04/07/14 140 lb 10.5 oz (63.8 kg)  03/21/14 135 lb (61.236 kg)  10/11/13 136 lb (61.689 kg)  03/28/13 125 lb 0.6 oz (56.718 kg)  03/07/13 127 lb (57.607 kg)  03/30/12 133 lb (60.328 kg)  01/16/12 128 lb 6.4 oz (58.242 kg)  06/30/11 132 lb 9.6 oz (60.147 kg)  03/31/11 136 lb (61.689 kg)  02/27/11 145 lb 3.2 oz (65.862 kg)  11/04/10 153 lb 6.4 oz (69.582 kg)  10/18/10 154 lb (69.854 kg)  08/01/10 144 lb 12.8 oz (65.681 kg)  07/16/10 141 lb (63.957 kg)  07/15/10 142 lb (64.411 kg)   The patient is a 68 year old man with a history significant for severe oxygen-dependent COPD and coronary artery disease-status post CABG who presented to the emergency department on 2014/04/25 with a complaint of chest pain and shortness of breath. In the ED, his chest x-ray revealed right upper lobe opacities, worrisome for an inflammatory process. His ABG on 3 L of oxygen revealed a pH of 7.38, PCO2 of 58, and PO2 of 101. He had difficulty urinating and a Foley catheter was placed in the ED. Flomax was added empirically. His urinalysis revealed no sign of infection. His cardiac enzymes have been negative.  He is being treated with Xopenex and Atrovent nebulizers, IV>>po Levaquin IV Solu-Medrol. Valium was restarted and titrated up to 5 mg every 6 hours as needed for anxiousness.  He is slow to recover and will likely need several more days of hospitalization. We'll keep the Foley catheter in for now and would start a voiding trial at approximately 24 hours before the anticipated discharge.... which will likely be toward the end of this week.   Chart reviewed due to LOS. Pt was asleep at time of visit and would not arouse to this RD's voice. Wt hx reveals wt gain, over the past year. Discharge disposition is to go to [facility] once medically stable.   Body mass index is 22.02 kg/(m^2). Patient meets criteria for normal weight  range based on current BMI.   Current diet order is Dysphagia 2, patient is consuming approximately 50% of meals at this time. Also receives Ensure Complete po BID, each supplement provides 350 kcal and 13 grams of protein, which pt takes well. Labs and medications reviewed.   No nutrition interventions warranted at this time. If nutrition issues arise, please consult RD.   Jeralynn Vaquera A. Mayford Knife, RD, LDN Pager: [phone]

## 2014-04-08 LAB — BASIC METABOLIC PANEL
ANION GAP: 5 (ref 5–15)
BUN: 49 mg/dL — ABNORMAL HIGH (ref 6–23)
CHLORIDE: 95 meq/L — AB (ref 96–112)
CO2: 39 mEq/L — ABNORMAL HIGH (ref 19–32)
CREATININE: 0.93 mg/dL (ref 0.50–1.35)
Calcium: 7.9 mg/dL — ABNORMAL LOW (ref 8.4–10.5)
GFR calc Af Amer: >90 mL/min (ref >90)
GFR calc non Af Amer: 84 mL/min — ABNORMAL LOW (ref >90)
Glucose, Bld: 99 mg/dL (ref 70–99)
Potassium: 4.4 mEq/L (ref 3.7–5.3)
SODIUM: 139 meq/L (ref 137–147)

## 2014-04-08 LAB — CBC
HCT: 40.7 % (ref 39.0–52.0)
Hemoglobin: 13.3 g/dL (ref 13.0–17.0)
MCH: 30 pg (ref 26.0–34.0)
MCHC: 32.7 g/dL (ref 30.0–36.0)
MCV: 91.7 fL (ref 78.0–100.0)
PLATELETS: 219 10*3/uL (ref 150–400)
RBC: 4.44 MIL/uL (ref 4.22–5.81)
RDW: 12.9 % (ref 11.5–15.5)
WBC: 7.4 10*3/uL (ref 4.0–10.5)

## 2014-04-08 LAB — GLUCOSE, CAPILLARY
Glucose-Capillary: 89 mg/dL (ref 70–99)
Glucose-Capillary: 181 mg/dL — ABNORMAL HIGH (ref 70–99)
Glucose-Capillary: 162 mg/dL — ABNORMAL HIGH (ref 70–99)
Glucose-Capillary: 107 mg/dL — ABNORMAL HIGH (ref 70–99)

## 2014-04-08 MED ORDER — FUROSEMIDE 20 MG PO TABS
20.0000 mg | ORAL_TABLET | Freq: Every day | ORAL | Status: DC
  Administered 2014-04-08 – 2014-04-09 (×2): 20 mg via ORAL
  Filled 2014-04-08 (×2): qty 1

## 2014-04-08 NOTE — Progress Notes (Signed)
Subjective: He says he still doesn't feel well. Since I do not know him and have not seen him before I am not sure what his baseline is. He rates himself at 4/10 today. He is still coughing nonproductively  Objective: Vital signs in last 24 hours: Temp:  [97.5 F (36.4 C)-98.1 F (36.7 C)] 97.5 F (36.4 C) (08/29 0701) Pulse Rate:  [98-111] 98 (08/29 0701) Resp:  [20] 20 (08/28 2141) BP: (98-140)/(51-76) 118/63 mmHg (08/29 0701) SpO2:  [93 %-98 %] 93 % (08/29 0701) Weight change:  Last BM Date: 04/06/14  Intake/Output from previous day: 08/28 0701 - 08/29 0700 In: 360 [P.O.:360] Out: 750 [Urine:750]  PHYSICAL EXAM General appearance: alert, cooperative and moderate distress Resp: Diminished breath sounds bilaterally with some rhonchi and prolonged expiratory phase Cardio: regular rate and rhythm, S1, S2 normal, no murmur, click, rub or gallop GI: soft, non-tender; bowel sounds normal; no masses,  no organomegaly Extremities: Trace edema of his feet  Lab Results:  Results for orders placed during the hospital encounter of 04/01/2014 (from the past 48 hour(s))  GLUCOSE, CAPILLARY     Status: Abnormal   Collection Time    04/06/14 11:38 AM      Result Value Ref Range   Glucose-Capillary 181 (*) 70 - 99 mg/dL  GLUCOSE, CAPILLARY     Status: Abnormal   Collection Time    04/06/14  4:56 PM      Result Value Ref Range   Glucose-Capillary 166 (*) 70 - 99 mg/dL  GLUCOSE, CAPILLARY     Status: Abnormal   Collection Time    04/06/14  8:36 PM      Result Value Ref Range   Glucose-Capillary 134 (*) 70 - 99 mg/dL   Comment 1 Notify RN     Comment 2 Documented in Chart    CBC     Status: None   Collection Time    04/07/14  5:51 AM      Result Value Ref Range   WBC 6.7  4.0 - 10.5 K/uL   RBC 4.41  4.22 - 5.81 MIL/uL   Hemoglobin 13.2  13.0 - 17.0 g/dL   HCT 40.8  39.0 - 52.0 %   MCV 92.5  78.0 - 100.0 fL   MCH 29.9  26.0 - 34.0 pg   MCHC 32.4  30.0 - 36.0 g/dL   RDW 12.8   11.5 - 15.5 %   Platelets 218  150 - 400 K/uL  BASIC METABOLIC PANEL     Status: Abnormal   Collection Time    04/07/14  5:51 AM      Result Value Ref Range   Sodium 140  137 - 147 mEq/L   Potassium 5.0  3.7 - 5.3 mEq/L   Chloride 95 (*) 96 - 112 mEq/L   CO2 42 (*) 19 - 32 mEq/L   Comment: CRITICAL RESULT CALLED TO, READ BACK BY AND VERIFIED WITH:     ROGERS,A AT 7:11AM ON 04/07/14 BY FESTERMAN,C   Glucose, Bld 127 (*) 70 - 99 mg/dL   BUN 53 (*) 6 - 23 mg/dL   Creatinine, Ser 1.12  0.50 - 1.35 mg/dL   Calcium 8.2 (*) 8.4 - 10.5 mg/dL   GFR calc non Af Amer 65 (*) >90 mL/min   GFR calc Af Amer 76 (*) >90 mL/min   Comment: (NOTE)     The eGFR has been calculated using the CKD EPI equation.     This calculation has not  been validated in all clinical situations.     eGFR's persistently <90 mL/min signify possible Chronic Kidney     Disease.   Anion gap 3 (*) 5 - 15  GLUCOSE, CAPILLARY     Status: Abnormal   Collection Time    04/07/14  7:30 AM      Result Value Ref Range   Glucose-Capillary 112 (*) 70 - 99 mg/dL   Comment 1 Documented in Chart     Comment 2 Notify RN    GLUCOSE, CAPILLARY     Status: Abnormal   Collection Time    04/07/14 11:03 AM      Result Value Ref Range   Glucose-Capillary 133 (*) 70 - 99 mg/dL   Comment 1 Notify RN     Comment 2 Documented in Chart    GLUCOSE, CAPILLARY     Status: Abnormal   Collection Time    04/07/14  4:12 PM      Result Value Ref Range   Glucose-Capillary 151 (*) 70 - 99 mg/dL   Comment 1 Documented in Chart     Comment 2 Notify RN    GLUCOSE, CAPILLARY     Status: Abnormal   Collection Time    04/07/14  8:39 PM      Result Value Ref Range   Glucose-Capillary 110 (*) 70 - 99 mg/dL   Comment 1 Notify RN     Comment 2 Documented in Chart    CBC     Status: None   Collection Time    04/08/14  5:59 AM      Result Value Ref Range   WBC 7.4  4.0 - 10.5 K/uL   RBC 4.44  4.22 - 5.81 MIL/uL   Hemoglobin 13.3  13.0 - 17.0 g/dL    HCT 40.7  39.0 - 52.0 %   MCV 91.7  78.0 - 100.0 fL   MCH 30.0  26.0 - 34.0 pg   MCHC 32.7  30.0 - 36.0 g/dL   RDW 12.9  11.5 - 15.5 %   Platelets 219  150 - 400 K/uL  BASIC METABOLIC PANEL     Status: Abnormal   Collection Time    04/08/14  5:59 AM      Result Value Ref Range   Sodium 139  137 - 147 mEq/L   Potassium 4.4  3.7 - 5.3 mEq/L   Chloride 95 (*) 96 - 112 mEq/L   CO2 39 (*) 19 - 32 mEq/L   Glucose, Bld 99  70 - 99 mg/dL   BUN 49 (*) 6 - 23 mg/dL   Creatinine, Ser 0.93  0.50 - 1.35 mg/dL   Calcium 7.9 (*) 8.4 - 10.5 mg/dL   GFR calc non Af Amer 84 (*) >90 mL/min   GFR calc Af Amer >90  >90 mL/min   Comment: (NOTE)     The eGFR has been calculated using the CKD EPI equation.     This calculation has not been validated in all clinical situations.     eGFR's persistently <90 mL/min signify possible Chronic Kidney     Disease.   Anion gap 5  5 - 15  GLUCOSE, CAPILLARY     Status: None   Collection Time    04/08/14  7:16 AM      Result Value Ref Range   Glucose-Capillary 89  70 - 99 mg/dL   Comment 1 Notify RN     Comment 2 Documented in Chart  ABGS No results found for this basename: PHART, PCO2, PO2ART, TCO2, HCO3,  in the last 72 hours CULTURES No results found for this or any previous visit (from the past 240 hour(s)). Studies/Results: No results found.  Medications:  Prior to Admission:  Prescriptions prior to admission  Medication Sig Dispense Refill  . albuterol (PROVENTIL) (2.5 MG/3ML) 0.083% nebulizer solution Take 2.5 mg by nebulization 4 (four) times daily.       Marland Kitchen alendronate (FOSAMAX) 70 MG tablet Take 1 tablet by mouth once a week. Takes on Sundays.      Marland Kitchen aspirin EC 81 MG tablet Take 81 mg by mouth daily.      . budesonide-formoterol (SYMBICORT) 160-4.5 MCG/ACT inhaler Inhale 2 puffs into the lungs 2 (two) times daily.  3 Inhaler  1  . diltiazem (CARDIZEM CD) 120 MG 24 hr capsule Take 120 mg by mouth daily.       . furosemide (LASIX) 20 MG  tablet Take 1 tablet (20 mg total) by mouth daily.  30 tablet  1  . ipratropium (ATROVENT) 0.02 % nebulizer solution Take 0.5 mg by nebulization 4 (four) times daily.       . isosorbide mononitrate (IMDUR) 60 MG 24 hr tablet Take 120 mg by mouth daily.      . meloxicam (MOBIC) 15 MG tablet Take 15 mg by mouth daily.      . nitroGLYCERIN (NITROSTAT) 0.4 MG SL tablet Place 1 tablet (0.4 mg total) under the tongue every 5 (five) minutes as needed for chest pain. Take one tablet under tongue at onset of chest pain: you may repeat every 5 minutes for up to 3 doses  90 tablet  0  . pantoprazole (PROTONIX) 40 MG tablet Take one by mouth twice daily       . potassium chloride (K-DUR) 10 MEQ tablet Take 1 tablet (10 mEq total) by mouth daily.  30 tablet  1  . simvastatin (ZOCOR) 40 MG tablet Take 40 mg by mouth at bedtime.       Scheduled: . aspirin EC  81 mg Oral Daily  . diltiazem  180 mg Oral Daily  . feeding supplement (ENSURE COMPLETE)  237 mL Oral BID BM  . guaiFENesin  600 mg Oral BID  . heparin  5,000 Units Subcutaneous 3 times per day  . insulin aspart  0-15 Units Subcutaneous TID WC  . insulin aspart  0-5 Units Subcutaneous QHS  . insulin detemir  5 Units Subcutaneous BID  . ipratropium-albuterol  3 mL Nebulization QID  . isosorbide mononitrate  120 mg Oral Daily  . methylPREDNISolone (SOLU-MEDROL) injection  80 mg Intravenous 4 times per day  . mometasone-formoterol  2 puff Inhalation BID  . pantoprazole  40 mg Oral Daily  . senna-docusate  1 tablet Oral QHS  . simvastatin  40 mg Oral QHS  . tamsulosin  0.4 mg Oral QHS   Continuous:  JOI:NOMVEHMCNOBSJ, acetaminophen, alum & mag hydroxide-simeth, diazepam, nitroGLYCERIN, ondansetron (ZOFRAN) IV, ondansetron, oxyCODONE  Assesment: He is admitted with acute on chronic respiratory failure. He has community-acquired pneumonia and COPD. His COPD is known to be pretty severe at baseline and he is very slowly improving Principal Problem:    CAP (community acquired pneumonia) Active Problems:   HYPERTENSION   Coronary atherosclerosis of artery bypass graft   COPD with exacerbation   Pleuritic chest pain   Chronic respiratory failure with hypoxia   Generalized anxiety disorder   Urinary retention   Hyperglycemia, drug-induced  Plan: I don't think is really anything else that at this time would continue with current medications    LOS: 8 days   Juliett Eastburn L 04/08/2014, 10:23 AM

## 2014-04-08 NOTE — Progress Notes (Signed)
[facility] PROGRESS NOTE  [name]:[id number] DOB: [birth year] DOA: April 27, 2014 PCP: Colette Ribas, MD    Code Status:  DO NOT RESUSCITATE Family Communication: Family not available; discussed with patient Disposition Plan: Discharge when clinically appropriate    Consultants:  Pulmonology  Procedures:  None  Antibiotics:  Levaquin 8/21>>8/28  Brief history: The patient is a 68 year old man with a history significant for severe oxygen-dependent COPD and coronary artery disease-status post CABG who presented to the emergency department on April 27, 2014 with a complaint of chest pain and shortness of breath. In the ED, his chest x-ray revealed right upper lobe opacities, worrisome for an inflammatory process. His ABG on 3 L of oxygen revealed a pH of 7.38, PCO2 of 58, and PO2 of 101. He had difficulty urinating and a Foley catheter was placed in the ED. Flomax was added empirically. His urinalysis revealed no sign of infection. His cardiac enzymes have been negative. He is being treated with Xopenex and Atrovent nebulizers, IV>>po Levaquin IV Solu-Medrol. Valium was restarted and titrated up to 5 mg every 6 hours as needed for anxiousness. He is slow to recover and will likely need several more days of hospitalization. We'll keep the Foley catheter in for now and would start a voiding trial at approximately 24 hours before the anticipated discharge.... which will likely be toward the end of this week.     Subjective: Continues to have nonproductive cough. He does not feel significantly improved. Does not feel that he is approaching his baseline  Objective: Filed Vitals:   04/08/14 1310  BP: 118/65  Pulse: 103  Temp: 98.1 F (36.7 C)  Resp: 20    Intake/Output Summary (Last 24 hours) at 04/08/14 1946 Last data filed at 04/08/14 1801  Gross per 24 hour  Intake    600 ml  Output    650 ml  Net    -50 ml   Filed Weights   04/05/14 0458 04/06/14 0555  04/07/14 0500  Weight: 63.4 kg (139 lb 12.4 oz) 63.7 kg (140 lb 6.9 oz) 63.8 kg (140 lb 10.5 oz)    Exam:   General:  Small framed frail 68 year-old man sitting in bed with chronic dyspnea.  Cardiovascular: S1, S2, with tachycardia. 1+ pedal edema.  Respiratory: diminished breath sounds bilaterally with rhonchi bilaterally. Chronic pursed lip breathing; barrel chest.  Abdomen: Positive bowel sounds, soft, nontender, nondistended.  Musculoskeletal: No acute hot red joints.  Neuropsychiatric: He is alert and oriented x3. Mild tremor diffusely. Less anxiousness. His speech is clear. Cranial nerves II through XII are intact.   Data Reviewed: Basic Metabolic Panel:  Recent Labs Lab 04/03/14 [phone] 04/04/14 0620 04/06/14 0658 04/07/14 0551 04/08/14 0559  NA 138 140 139 140 139  K 5.1 5.2 5.1 5.0 4.4  CL 98 99 98 95* 95*  CO2 34* 34* 36* 42* 39*  GLUCOSE 128* 108* 114* 127* 99  BUN 29* 28* 36* 53* 49*  CREATININE 0.92 0.87 0.85 1.12 0.93  CALCIUM 8.0* 8.2* 8.3* 8.2* 7.9*   Liver Function Tests: No results found for this basename: AST, ALT, ALKPHOS, BILITOT, PROT, ALBUMIN,  in the last 168 hours No results found for this basename: LIPASE, AMYLASE,  in the last 168 hours No results found for this basename: AMMONIA,  in the last 168 hours CBC:  Recent Labs Lab 04/03/14 0632 04/07/14 0551 04/08/14 0559  WBC 8.9 6.7 7.4  HGB 12.5* 13.2 13.3  HCT 38.6* 40.8 40.7  MCV 92.6  92.5 91.7  PLT 221 218 219   Cardiac Enzymes: No results found for this basename: CKTOTAL, CKMB, CKMBINDEX, TROPONINI,  in the last 168 hours BNP (last 3 results)  Recent Labs  03/28/2014 1522  PROBNP 1416.0*   CBG:  Recent Labs Lab 04/07/14 1612 04/07/14 2039 04/08/14 0716 04/08/14 1119 04/08/14 1634  GLUCAP 151* 110* 89 181* 162*    No results found for this or any previous visit (from the past 240 hour(s)).   Studies: No results found.  Scheduled Meds: . aspirin EC  81 mg Oral  Daily  . diltiazem  180 mg Oral Daily  . feeding supplement (ENSURE COMPLETE)  237 mL Oral BID BM  . guaiFENesin  600 mg Oral BID  . heparin  5,000 Units Subcutaneous 3 times per day  . insulin aspart  0-15 Units Subcutaneous TID WC  . insulin aspart  0-5 Units Subcutaneous QHS  . insulin detemir  5 Units Subcutaneous BID  . ipratropium-albuterol  3 mL Nebulization QID  . isosorbide mononitrate  120 mg Oral Daily  . methylPREDNISolone (SOLU-MEDROL) injection  80 mg Intravenous 4 times per day  . mometasone-formoterol  2 puff Inhalation BID  . pantoprazole  40 mg Oral Daily  . senna-docusate  1 tablet Oral QHS  . simvastatin  40 mg Oral QHS  . tamsulosin  0.4 mg Oral QHS   Continuous Infusions:   Assessment and plan:  Principal Problem:   CAP (community acquired pneumonia) Active Problems:   HYPERTENSION   Coronary atherosclerosis of artery bypass graft   COPD with exacerbation   Pleuritic chest pain   Chronic respiratory failure with hypoxia   Generalized anxiety disorder   Urinary retention   Hyperglycemia, drug-induced   1. Community-acquired pneumonia. He has completed a course of Levaquin. He is now afebrile. We'll continue oxygen, Mucinex, and supportive treatment. His followup chest x-ray on 04/04/14 reveals resolution of right upper lung opacities.  Oxygen-dependent COPD with exacerbation. Patient has been slow to improve. He is on bronchodilators, inhaled steroids, intravenous steroids, mucolytics and flutter valve. He is on high dose IV steroids. Appreciate pulmonology assistance. Anticipate continued slow recovery. Chronic respiratory failure with chronic hypoxia and hypercapnia. Patient's ABG on 3 L of oxygen following admission revealed a pH of 7.38, PCO2 of 58, PO2 of 101. This is likely his baseline. Pleuritic chest pain in a patient with CAD. Likely secondary to pneumonia and increase in work of breathing. His troponin I was negative x3. We'll continue  analgesics as needed. Generalized anxiety disorder. He was recently weaned off of diazepam 10 mg 4 times daily. Diazepam ordered per his request because of anxiousness and titrated to 5 mg from 2.5 mg  every 6 hours as needed. TSH is within normal limits. Urinary hesitancy/retention. Foley catheter was placed in the ED. Flomax was started. UA negative for wbcs. Would do a voiding trial when he is clinically improved and approximately 24 hours before anticipated discharge.. Steroid-induced hyperglycemia. We'll continue sliding scale NovoLog and Levemir. Hemoglobin A1c is 5.6. Constipation. When necessary Dulcolax suppository and laxative therapy started. Given milk and molasses enema Lower extremity edema. Possibly related to venous stasis/right-sided heart failure from pulmonary hypertension. He had been on intravenous Lasix, but BUN started to increase. Since he still has evidence of lower extremity edema, we'll start Lasix by mouth dose.   Disposition: needs SNF placement.   Time spent: 25 minutes    [facility]  [facility] Pager [phone]. If 7PM-7AM, please contact night-coverage  at [email or web address], password [facility] 04/08/2014, 7:46 PM  LOS: 8 days

## 2014-04-08 NOTE — Progress Notes (Signed)
Nurse called for sob of PT, PT given prn duo neb tx. No wheezes noted, but PT appears dyspneic , has congested cough still

## 2014-04-08 NOTE — Progress Notes (Signed)
PT has been a sleep since early shift , am holding neb treatment and Dulera mdi till he awakens. Will continue to monitor. H

## 2014-04-09 LAB — COMPREHENSIVE METABOLIC PANEL
ALT: 21 U/L (ref 0–53)
AST: 14 U/L (ref 0–37)
Albumin: 2.5 g/dL — ABNORMAL LOW (ref 3.5–5.2)
Alkaline Phosphatase: 51 U/L (ref 39–117)
Anion gap: 7 (ref 5–15)
BILIRUBIN TOTAL: 0.5 mg/dL (ref 0.3–1.2)
BUN: 52 mg/dL — AB (ref 6–23)
CHLORIDE: 96 meq/L (ref 96–112)
CO2: 38 meq/L — AB (ref 19–32)
Calcium: 7.7 mg/dL — ABNORMAL LOW (ref 8.4–10.5)
Creatinine, Ser: 0.94 mg/dL (ref 0.50–1.35)
GFR calc Af Amer: >90 mL/min (ref >90)
GFR calc non Af Amer: 83 mL/min — ABNORMAL LOW (ref >90)
GLUCOSE: 112 mg/dL — AB (ref 70–99)
Potassium: 4.7 mEq/L (ref 3.7–5.3)
SODIUM: 141 meq/L (ref 137–147)
Total Protein: 4.9 g/dL — ABNORMAL LOW (ref 6.0–8.3)

## 2014-04-09 LAB — GLUCOSE, CAPILLARY
GLUCOSE-CAPILLARY: 158 mg/dL — AB (ref 70–99)
GLUCOSE-CAPILLARY: 158 mg/dL — AB (ref 70–99)
Glucose-Capillary: 109 mg/dL — ABNORMAL HIGH (ref 70–99)
Glucose-Capillary: 167 mg/dL — ABNORMAL HIGH (ref 70–99)

## 2014-04-09 MED ORDER — MORPHINE SULFATE (CONCENTRATE) 10 MG /0.5 ML PO SOLN
10.0000 mg | ORAL | Status: DC | PRN
  Administered 2014-04-09 – 2014-04-12 (×11): 10 mg via SUBLINGUAL
  Filled 2014-04-09 (×11): qty 0.5

## 2014-04-09 NOTE — Progress Notes (Signed)
PT awake Neb given.

## 2014-04-09 NOTE — Progress Notes (Signed)
He says he feels a little bit better. He has no new complaints. His breathing is doing slightly better. He is coughing some.  He is awake and alert. His chest is clearer than yesterday. He is still on. He still looks dyspneic at rest. His heart is regular. His abdomen is soft. Extremities showed no edema. Central nervous system exam is grossly intact  He has severe COPD and has pneumonia. He is improving. This is very slow  Continue current treatments

## 2014-04-09 NOTE — Progress Notes (Signed)
[facility] PROGRESS NOTE  [name] ZOX:[id number] DOB: Mar 25, 1946 DOA: 05-Apr-2014 PCP: Colette Ribas, MD    Code Status:  DO NOT RESUSCITATE Family Communication: Discussed at length with his sister Disposition Plan: Discharge when clinically appropriate    Consultants:  Pulmonology  Procedures:  None  Antibiotics:  Levaquin 8/21>>8/28  Brief history: The patient is a 68 year old man with a history significant for severe oxygen-dependent COPD and coronary artery disease-status post CABG who presented to the emergency department on 2014-04-05 with a complaint of chest pain and shortness of breath. In the ED, his chest x-ray revealed right upper lobe opacities, worrisome for an inflammatory process. His ABG on 3 L of oxygen revealed a pH of 7.38, PCO2 of 58, and PO2 of 101. He had difficulty urinating and a Foley catheter was placed in the ED. Flomax was added empirically. His urinalysis revealed no sign of infection. His cardiac enzymes have been negative. He is being treated with Xopenex and Atrovent nebulizers, IV>>po Levaquin IV Solu-Medrol. Valium was restarted and titrated up to 5 mg every 6 hours as needed for anxiousness. He is slow to recover and will likely need several more days of hospitalization. We'll keep the Foley catheter in for now and would start a voiding trial at approximately 24 hours before the anticipated discharge.... which will likely be toward the end of this week.     Subjective: Still feels very short of breath. Does not feel he is back to baseline.  Objective: Filed Vitals:   04/09/14 1320  BP: 137/82  Pulse: 120  Temp: 97.6 F (36.4 C)  Resp: 18    Intake/Output Summary (Last 24 hours) at 04/09/14 2035 Last data filed at 04/09/14 1800  Gross per 24 hour  Intake    360 ml  Output   1200 ml  Net   -840 ml   Filed Weights   04/07/14 0500 04/09/14 0500 04/09/14 0659  Weight: 63.8 kg (140 lb 10.5 oz) 64.6 kg (142 lb 6.7 oz)  60.4 kg (133 lb 2.5 oz)    Exam:   General:  Small framed frail 68 year-old man sitting in bed with chronic dyspnea.  Cardiovascular: S1, S2, with tachycardia. 1+ pedal edema.  Respiratory: diminished breath sounds bilaterally with rhonchi bilaterally. Chronic pursed lip breathing; barrel chest.  Abdomen: Positive bowel sounds, soft, nontender, nondistended.  Musculoskeletal: No acute hot red joints.  Neuropsychiatric: He is alert and oriented x3. Mild tremor diffusely. Less anxiousness. His speech is clear. Cranial nerves II through XII are intact.   Data Reviewed: Basic Metabolic Panel:  Recent Labs Lab 04/04/14 0620 04/06/14 0658 04/07/14 0551 04/08/14 0559 04/09/14 0618  NA 140 139 140 139 141  K 5.2 5.1 5.0 4.4 4.7  CL 99 98 95* 95* 96  CO2 34* 36* 42* 39* 38*  GLUCOSE 108* 114* 127* 99 112*  BUN 28* 36* 53* 49* 52*  CREATININE 0.87 0.85 1.12 0.93 0.94  CALCIUM 8.2* 8.3* 8.2* 7.9* 7.7*   Liver Function Tests:  Recent Labs Lab 04/09/14 0618  AST 14  ALT 21  ALKPHOS 51  BILITOT 0.5  PROT 4.9*  ALBUMIN 2.5*   No results found for this basename: LIPASE, AMYLASE,  in the last 168 hours No results found for this basename: AMMONIA,  in the last 168 hours CBC:  Recent Labs Lab 04/03/14 0632 04/07/14 0551 04/08/14 0559  WBC 8.9 6.7 7.4  HGB 12.5* 13.2 13.3  HCT 38.6* 40.8 40.7  MCV 92.6  92.5 91.7  PLT 221 218 219   Cardiac Enzymes: No results found for this basename: CKTOTAL, CKMB, CKMBINDEX, TROPONINI,  in the last 168 hours BNP (last 3 results)  Recent Labs  Apr 23, 2014 1522  PROBNP 1416.0*   CBG:  Recent Labs Lab 04/08/14 1634 04/08/14 2153 04/09/14 0744 04/09/14 1119 04/09/14 1638  GLUCAP 162* 107* 109* 167* 158*    No results found for this or any previous visit (from the past 240 hour(s)).   Studies: No results found.  Scheduled Meds: . aspirin EC  81 mg Oral Daily  . diltiazem  180 mg Oral Daily  . feeding supplement (ENSURE  COMPLETE)  237 mL Oral BID BM  . furosemide  20 mg Oral Daily  . guaiFENesin  600 mg Oral BID  . heparin  5,000 Units Subcutaneous 3 times per day  . insulin aspart  0-15 Units Subcutaneous TID WC  . insulin aspart  0-5 Units Subcutaneous QHS  . insulin detemir  5 Units Subcutaneous BID  . ipratropium-albuterol  3 mL Nebulization QID  . isosorbide mononitrate  120 mg Oral Daily  . methylPREDNISolone (SOLU-MEDROL) injection  80 mg Intravenous 4 times per day  . mometasone-formoterol  2 puff Inhalation BID  . pantoprazole  40 mg Oral Daily  . senna-docusate  1 tablet Oral QHS  . simvastatin  40 mg Oral QHS  . tamsulosin  0.4 mg Oral QHS   Continuous Infusions:   Assessment and plan:  Principal Problem:   CAP (community acquired pneumonia) Active Problems:   HYPERTENSION   Coronary atherosclerosis of artery bypass graft   COPD with exacerbation   Pleuritic chest pain   Chronic respiratory failure with hypoxia   Generalized anxiety disorder   Urinary retention   Hyperglycemia, drug-induced   1. Community-acquired pneumonia. He has completed a course of Levaquin. He is now afebrile. We'll continue oxygen, Mucinex, and supportive treatment. His followup chest x-ray on 04/04/14 reveals resolution of right upper lung opacities.  Oxygen-dependent COPD with exacerbation. Patient has been very slow to improve. He is on bronchodilators, inhaled steroids, intravenous steroids, mucolytics and flutter valve. He is on high dose IV steroids. Appreciate pulmonology assistance. Anticipate continued slow recovery. He still appears to be dyspneic at rest. I will add low dose morphine to assist with respiratory distress Chronic respiratory failure with chronic hypoxia and hypercapnia. Patient's ABG on 3 L of oxygen following admission revealed a pH of 7.38, PCO2 of 58, PO2 of 101. This is likely his baseline. Pleuritic chest pain in a patient with CAD. Likely secondary to pneumonia and increase in  work of breathing. His troponin I was negative x3. We'll continue analgesics as needed. Generalized anxiety disorder. He was recently weaned off of diazepam 10 mg 4 times daily. Diazepam ordered per his request because of anxiousness and titrated to 5 mg from 2.5 mg  every 6 hours as needed. TSH is within normal limits. Urinary hesitancy/retention. Foley catheter was placed in the ED. Flomax was started. UA negative for wbcs. Would do a voiding trial when he is clinically improved and approximately 24 hours before anticipated discharge.. Steroid-induced hyperglycemia. We'll continue sliding scale NovoLog and Levemir. Hemoglobin A1c is 5.6. Constipation. When necessary Dulcolax suppository and laxative therapy started. Given milk and molasses enema Lower extremity edema. Possibly related to venous stasis/right-sided heart failure from pulmonary hypertension. He had been on intravenous Lasix, but BUN started to increase. Since he still has evidence of lower extremity edema, we'll start Lasix by  mouth dose.   Disposition: needs SNF placement.   Time spent: 25 minutes    [facility]  [facility] Pager [phone]. If 7PM-7AM, please contact night-coverage at [email or web address], password [facility] 04/09/2014, 8:35 PM  LOS: 9 days

## 2014-04-10 LAB — BASIC METABOLIC PANEL
Anion gap: 4 — ABNORMAL LOW (ref 5–15)
BUN: 62 mg/dL — ABNORMAL HIGH (ref 6–23)
CO2: 42 mEq/L (ref 19–32)
CREATININE: 0.98 mg/dL (ref 0.50–1.35)
Calcium: 7.9 mg/dL — ABNORMAL LOW (ref 8.4–10.5)
Chloride: 95 mEq/L — ABNORMAL LOW (ref 96–112)
GFR calc Af Amer: >90 mL/min (ref >90)
GFR calc non Af Amer: 82 mL/min — ABNORMAL LOW (ref >90)
GLUCOSE: 121 mg/dL — AB (ref 70–99)
Potassium: 4.9 mEq/L (ref 3.7–5.3)
Sodium: 141 mEq/L (ref 137–147)

## 2014-04-10 LAB — GLUCOSE, CAPILLARY
GLUCOSE-CAPILLARY: 111 mg/dL — AB (ref 70–99)
GLUCOSE-CAPILLARY: 180 mg/dL — AB (ref 70–99)
GLUCOSE-CAPILLARY: 155 mg/dL — AB (ref 70–99)
Glucose-Capillary: 151 mg/dL — ABNORMAL HIGH (ref 70–99)
Glucose-Capillary: 175 mg/dL — ABNORMAL HIGH (ref 70–99)

## 2014-04-10 NOTE — Plan of Care (Signed)
Pt having great diff breathing  - Resp placing on 55% venti mask to increase sats.  Pt responding to voice but only with 1-2 words. - Family notified of change in status via phone and suggested that they return to the hospital.

## 2014-04-10 NOTE — Progress Notes (Signed)
[facility] PROGRESS NOTE  [name] WUJ:[id number] DOB: February 07, 1946 DOA: April 30, 2014 PCP: Colette Ribas, MD    Code Status:  DO NOT RESUSCITATE Family Communication: Discussed at length with his sister Disposition Plan: Discharge when clinically appropriate    Consultants:  Pulmonology  Procedures:  None  Antibiotics:  Levaquin 8/21>>8/28  Brief history: The patient is a 68 year old man with a history significant for severe oxygen-dependent COPD and coronary artery disease-status post CABG who presented to the emergency department on 04/30/2014 with a complaint of chest pain and shortness of breath. In the ED, his chest x-ray revealed right upper lobe opacities, worrisome for an inflammatory process. His ABG on 3 L of oxygen revealed a pH of 7.38, PCO2 of 58, and PO2 of 101. He had difficulty urinating and a Foley catheter was placed in the ED. Flomax was added empirically. His urinalysis revealed no sign of infection. His cardiac enzymes have been negative. He is being treated with Xopenex and Atrovent nebulizers, IV>>po Levaquin IV Solu-Medrol. Valium was restarted and titrated up to 5 mg every 6 hours as needed for anxiousness. He is slow to recover and will likely need several more days of hospitalization. We'll keep the Foley catheter in for now and would start a voiding trial at approximately 24 hours before the anticipated discharge.... which will likely be toward the end of this week.     Subjective: Patient is drowsy. He reports that he feels no better than yesterday  Objective: Filed Vitals:   04/10/14 1700  BP: 154/73  Pulse: 105  Temp: 97.9 F (36.6 C)  Resp: 16    Intake/Output Summary (Last 24 hours) at 04/10/14 1909 Last data filed at 04/10/14 1905  Gross per 24 hour  Intake    360 ml  Output   1400 ml  Net  -1040 ml   Filed Weights   04/09/14 0500 04/09/14 0659 04/10/14 0457  Weight: 64.6 kg (142 lb 6.7 oz) 60.4 kg (133 lb 2.5 oz) 62.1  kg (136 lb 14.5 oz)    Exam:   General:  Small framed frail 68 year-old man sitting in bed with chronic dyspnea.  Cardiovascular: S1, S2, with tachycardia. 1+ pedal edema.  Respiratory: diminished breath sounds bilaterally with rhonchi bilaterally. Chronic pursed lip breathing; barrel chest.  Abdomen: Positive bowel sounds, soft, nontender, nondistended.  Musculoskeletal: No acute hot red joints.  Neuropsychiatric: He is alert and oriented x3. Mild tremor diffusely. Less anxiousness. His speech is clear. Cranial nerves II through XII are intact.   Data Reviewed: Basic Metabolic Panel:  Recent Labs Lab 04/06/14 0658 04/07/14 0551 04/08/14 0559 04/09/14 0618 04/10/14 0636  NA 139 140 139 141 141  K 5.1 5.0 4.4 4.7 4.9  CL 98 95* 95* 96 95*  CO2 36* 42* 39* 38* 42*  GLUCOSE 114* 127* 99 112* 121*  BUN 36* 53* 49* 52* 62*  CREATININE 0.85 1.12 0.93 0.94 0.98  CALCIUM 8.3* 8.2* 7.9* 7.7* 7.9*   Liver Function Tests:  Recent Labs Lab 04/09/14 0618  AST 14  ALT 21  ALKPHOS 51  BILITOT 0.5  PROT 4.9*  ALBUMIN 2.5*   No results found for this basename: LIPASE, AMYLASE,  in the last 168 hours No results found for this basename: AMMONIA,  in the last 168 hours CBC:  Recent Labs Lab 04/07/14 0551 04/08/14 0559  WBC 6.7 7.4  HGB 13.2 13.3  HCT 40.8 40.7  MCV 92.5 91.7  PLT 218 219   Cardiac  Enzymes: No results found for this basename: CKTOTAL, CKMB, CKMBINDEX, TROPONINI,  in the last 168 hours BNP (last 3 results)  Recent Labs  04/05/2014 1522  PROBNP 1416.0*   CBG:  Recent Labs Lab 04/09/14 1638 04/09/14 2228 04/10/14 0800 04/10/14 1148 04/10/14 1711  GLUCAP 158* 158* 111* 151* 175*    No results found for this or any previous visit (from the past 240 hour(s)).   Studies: No results found.  Scheduled Meds: . aspirin EC  81 mg Oral Daily  . diltiazem  180 mg Oral Daily  . feeding supplement (ENSURE COMPLETE)  237 mL Oral BID BM  .  furosemide  20 mg Oral Daily  . guaiFENesin  600 mg Oral BID  . heparin  5,000 Units Subcutaneous 3 times per day  . insulin aspart  0-15 Units Subcutaneous TID WC  . insulin aspart  0-5 Units Subcutaneous QHS  . insulin detemir  5 Units Subcutaneous BID  . ipratropium-albuterol  3 mL Nebulization QID  . isosorbide mononitrate  120 mg Oral Daily  . methylPREDNISolone (SOLU-MEDROL) injection  80 mg Intravenous 4 times per day  . mometasone-formoterol  2 puff Inhalation BID  . pantoprazole  40 mg Oral Daily  . senna-docusate  1 tablet Oral QHS  . simvastatin  40 mg Oral QHS  . tamsulosin  0.4 mg Oral QHS   Continuous Infusions:   Assessment and plan:  Principal Problem:   CAP (community acquired pneumonia) Active Problems:   HYPERTENSION   Coronary atherosclerosis of artery bypass graft   COPD with exacerbation   Pleuritic chest pain   Chronic respiratory failure with hypoxia   Generalized anxiety disorder   Urinary retention   Hyperglycemia, drug-induced   1. Community-acquired pneumonia. He has completed a course of Levaquin. He is now afebrile. We'll continue oxygen, Mucinex, and supportive treatment. His followup chest x-ray on 04/04/14 reveals resolution of right upper lung opacities.  Oxygen-dependent COPD with exacerbation. Patient has been very slow to improve. He is on bronchodilators, inhaled steroids, intravenous steroids, mucolytics and flutter valve. He is on high dose IV steroids. Appreciate pulmonology assistance. Anticipate continued slow recovery. He still appears to be dyspneic at rest. When necessary morphine added for respiratory distress. I discussed the case with Dr. Juanetta Gosling, we both agree that patient may be appropriate for hospice services. I discussed this with the patient's sister who feels the patient has been declining over the last 1 month. She feels that hospice also may be appropriate for the patient. At this time, since patient is somewhat drowsy will  plan on discussing this with the patient tomorrow once he is more awake. If he is agreeable, can possibly discharge to skilled nursing facility for rehabilitation. If the patient fails to improve, would recommend hospice services to follow. Chronic respiratory failure with chronic hypoxia and hypercapnia. Patient's ABG on 3 L of oxygen following admission revealed a pH of 7.38, PCO2 of 58, PO2 of 101. This is likely his baseline. Pleuritic chest pain in a patient with CAD. Likely secondary to pneumonia and increase in work of breathing. His troponin I was negative x3. We'll continue analgesics as needed. Generalized anxiety disorder. He was recently weaned off of diazepam 10 mg 4 times daily. Diazepam ordered per his request because of anxiousness and titrated to 5 mg from 2.5 mg  every 6 hours as needed. TSH is within normal limits. Urinary hesitancy/retention. Foley catheter was placed in the ED. Flomax was started. UA negative for wbcs.  Discontinue Foley catheter for voiding trial. Steroid-induced hyperglycemia. We'll continue sliding scale NovoLog and Levemir. Hemoglobin A1c is 5.6. Constipation. When necessary Dulcolax suppository and laxative therapy started. Given milk and molasses enema Lower extremity edema. Possibly related to venous stasis/right-sided heart failure from pulmonary hypertension. BUN continues to increase despite changing Lasix to by mouth. We'll discontinue Lasix for now   Disposition: needs SNF placement.   Time spent: 25 minutes    [facility]  [facility] Pager [phone]. If 7PM-7AM, please contact night-coverage at [email or web address], password [facility] 04/10/2014, 7:09 PM  LOS: 10 days

## 2014-04-10 NOTE — Progress Notes (Signed)
Physical Therapy Treatment Patient Details Name: [name] MRN: [id number] DOB: [birth year] Today's Date: 04/10/2014    History of Present Illness [name] is a 68 y.o. male  with a history of coronary artery disease, status post CABG, oxygen-dependent COPD, steroid-induced hyperglycemia (the patient denies history of diabetes) and chronic anxiety, who presents to the emergency department today with a chief complaint of chest pain and shortness of breath. Both symptoms started approximately one week ago, but have become progressively worse over the last 2-3 days. His pain is located centrally and to the left and is associated with coughing, wheezing, and pleurisy. He denies the pain as a tightness. He denies associated radiation, nausea, or diaphoresis. He has shortness of breath chronically, but he has been more short of breath over the past 2-3 days, particularly with any activity. He denies outright orthopnea. He has had more wheezing. He has had a cough with "yucky" colored sputum. He has been using his albuterol nebulizers 4-5 times a day with no improvement in his symptoms. Last week he had lower extremity swelling of both legs and presented to the ED. He was treated and discharged on Lasix on 03/21/14. He reports no further bilateral leg swelling. He denies associated fever or chills.  He also reports some difficulty urinating and a Foley catheter was inserted in the ED. He has had urinary hesitancy for some time now, but has been able to urinate some.    PT Comments    Pt with continued shortness of breath, difficulty getting patient to communicate with therapist due to fatigue.  Pt requires max encouragement from therapist to participate and push self further with ambulation today. Increased time required to transfer and ambulate due to rest breaks.  Pt. Able to increase ambulation distance to 10 feet today with therapist facilitation.  Pt up in recliner with visitor present at end of  session.   Follow Up Recommendations  SNF     Equipment Recommendations  None recommended by PT       Precautions / Restrictions Precautions Precautions: Fall Restrictions Weight Bearing Restrictions: No    Mobility  Bed Mobility Overal bed mobility: Modified Independent             General bed mobility comments: Pt takes extended time due to fatigue and shortness of breath  Transfers Overall transfer level: Modified independent Equipment used: Rolling walker (2 wheeled) Transfers: Sit to/from Stand Sit to Stand: Min guard         General transfer comment:  VC for deep breathing (in through nose, out through mouth); general safety awareness cues  Ambulation/Gait Ambulation/Gait assistance: Min guard Ambulation Distance (Feet): 10 Feet Assistive device: Rolling walker (2 wheeled) Gait Pattern/deviations: Decreased step length - right;Decreased step length - left;Trunk flexed     General Gait Details: Gait distance limited secondary to SOB/fatigue with activity.  Pt requires assistance from therapist to negotiate walker and progress with ambulation.   Stairs            Wheelchair Mobility    Modified Rankin (Stroke Patients Only)       Balance                                    Cognition Arousal/Alertness: Awake/alert Behavior During Therapy: WFL for tasks assessed/performed Overall Cognitive Status: Within Functional Limits for tasks assessed  Exercises      General Comments        Pertinent Vitals/Pain Pain Assessment: No/denies pain    Home Living                      Prior Function            PT Goals (current goals can now be found in the care plan section)         PT Plan  Continue with current POC per evaluating therapist    Co-evaluation             End of Session Equipment Utilized During Treatment: Gait belt;Oxygen Activity Tolerance: Patient limited by  fatigue;Other (comment) (Limited by SOB) Patient left: in chair;with call bell/phone within reach;with chair alarm set     Time: 1035-1100 PT Time Calculation (min): 25 min  Charges:  $Gait Training: 8-22 mins $Therapeutic Activity: 8-22 mins                    G Codes:      Lurena Nida, PTA/CLT 04/10/2014, 11:01 AM

## 2014-04-10 NOTE — Plan of Care (Signed)
Dr. Informed of critical CO2 of 42.

## 2014-04-10 NOTE — Progress Notes (Signed)
Patient's O2 sats 77% on arrival to administer breathing treatment at 18:55. RT placed patient on treatment. Sats came up to 85%. Patient's breath sounds are very diminished and wet sounding. Patient is semi-responsive and unable to complete a full sentence. Patient unable to do inhaler at this time. Placed patient on 55% Venturi Mask and notified RN. He stated that he would call the family since patient is a DNR.

## 2014-04-10 NOTE — Progress Notes (Signed)
Subjective: He says he feels a little better. He is still coughing mostly nonproductively. He is still short of breath.  Objective: Vital signs in last 24 hours: Temp:  [97.5 F (36.4 C)-97.6 F (36.4 C)] 97.5 F (36.4 C) (08/31 0529) Pulse Rate:  [75-120] 96 (08/31 0529) Resp:  [18-20] 20 (08/31 0529) BP: (114-137)/(66-82) 135/78 mmHg (08/31 0529) SpO2:  [89 %-96 %] 89 % (08/31 0717) Weight:  [62.1 kg (136 lb 14.5 oz)] 62.1 kg (136 lb 14.5 oz) (08/31 0457) Weight change: -2.5 kg (-5 lb 8.2 oz) Last BM Date: 04/06/14  Intake/Output from previous day: 08/30 0701 - 08/31 0700 In: 360 [P.O.:360] Out: 1500 [Urine:1500]  PHYSICAL EXAM General appearance: alert, cooperative and mild distress Resp: rhonchi bilaterally Cardio: regular rate and rhythm, S1, S2 normal, no murmur, click, rub or gallop GI: soft, non-tender; bowel sounds normal; no masses,  no organomegaly Extremities: extremities normal, atraumatic, no cyanosis or edema  Lab Results:  Results for orders placed during the hospital encounter of 04/10/2014 (from the past 48 hour(s))  GLUCOSE, CAPILLARY     Status: Abnormal   Collection Time    04/08/14 11:19 AM      Result Value Ref Range   Glucose-Capillary 181 (*) 70 - 99 mg/dL   Comment 1 Documented in Chart     Comment 2 Notify RN    GLUCOSE, CAPILLARY     Status: Abnormal   Collection Time    04/08/14  4:34 PM      Result Value Ref Range   Glucose-Capillary 162 (*) 70 - 99 mg/dL  GLUCOSE, CAPILLARY     Status: Abnormal   Collection Time    04/08/14  9:53 PM      Result Value Ref Range   Glucose-Capillary 107 (*) 70 - 99 mg/dL  COMPREHENSIVE METABOLIC PANEL     Status: Abnormal   Collection Time    04/09/14  6:18 AM      Result Value Ref Range   Sodium 141  137 - 147 mEq/L   Potassium 4.7  3.7 - 5.3 mEq/L   Chloride 96  96 - 112 mEq/L   CO2 38 (*) 19 - 32 mEq/L   Glucose, Bld 112 (*) 70 - 99 mg/dL   BUN 52 (*) 6 - 23 mg/dL   Creatinine, Ser 0.94  0.50 -  1.35 mg/dL   Calcium 7.7 (*) 8.4 - 10.5 mg/dL   Total Protein 4.9 (*) 6.0 - 8.3 g/dL   Albumin 2.5 (*) 3.5 - 5.2 g/dL   AST 14  0 - 37 U/L   ALT 21  0 - 53 U/L   Alkaline Phosphatase 51  39 - 117 U/L   Total Bilirubin 0.5  0.3 - 1.2 mg/dL   GFR calc non Af Amer 83 (*) >90 mL/min   GFR calc Af Amer >90  >90 mL/min   Comment: (NOTE)     The eGFR has been calculated using the CKD EPI equation.     This calculation has not been validated in all clinical situations.     eGFR's persistently <90 mL/min signify possible Chronic Kidney     Disease.   Anion gap 7  5 - 15  GLUCOSE, CAPILLARY     Status: Abnormal   Collection Time    04/09/14  7:44 AM      Result Value Ref Range   Glucose-Capillary 109 (*) 70 - 99 mg/dL   Comment 1 Notify RN     Comment  2 Documented in Chart    GLUCOSE, CAPILLARY     Status: Abnormal   Collection Time    04/09/14 11:19 AM      Result Value Ref Range   Glucose-Capillary 167 (*) 70 - 99 mg/dL   Comment 1 Documented in Chart     Comment 2 Notify RN    GLUCOSE, CAPILLARY     Status: Abnormal   Collection Time    04/09/14  4:38 PM      Result Value Ref Range   Glucose-Capillary 158 (*) 70 - 99 mg/dL   Comment 1 Documented in Chart     Comment 2 Notify RN    GLUCOSE, CAPILLARY     Status: Abnormal   Collection Time    04/09/14 10:28 PM      Result Value Ref Range   Glucose-Capillary 158 (*) 70 - 99 mg/dL   Comment 1 Documented in Chart     Comment 2 Notify RN    BASIC METABOLIC PANEL     Status: Abnormal   Collection Time    04/10/14  6:36 AM      Result Value Ref Range   Sodium 141  137 - 147 mEq/L   Potassium 4.9  3.7 - 5.3 mEq/L   Chloride 95 (*) 96 - 112 mEq/L   CO2 42 (*) 19 - 32 mEq/L   Comment: CRITICAL RESULT CALLED TO, READ BACK BY AND VERIFIED WITH:     WRIGHT,M AT 7:40AM ON 04/10/14 BY FESTERMAN,C   Glucose, Bld 121 (*) 70 - 99 mg/dL   BUN 62 (*) 6 - 23 mg/dL   Creatinine, Ser 0.98  0.50 - 1.35 mg/dL   Calcium 7.9 (*) 8.4 - 10.5  mg/dL   GFR calc non Af Amer 82 (*) >90 mL/min   GFR calc Af Amer >90  >90 mL/min   Comment: (NOTE)     The eGFR has been calculated using the CKD EPI equation.     This calculation has not been validated in all clinical situations.     eGFR's persistently <90 mL/min signify possible Chronic Kidney     Disease.   Anion gap 4 (*) 5 - 15  GLUCOSE, CAPILLARY     Status: Abnormal   Collection Time    04/10/14  8:00 AM      Result Value Ref Range   Glucose-Capillary 111 (*) 70 - 99 mg/dL   Comment 1 Notify RN      ABGS No results found for this basename: PHART, PCO2, PO2ART, TCO2, HCO3,  in the last 72 hours CULTURES No results found for this or any previous visit (from the past 240 hour(s)). Studies/Results: No results found.  Medications:  Prior to Admission:  Prescriptions prior to admission  Medication Sig Dispense Refill  . albuterol (PROVENTIL) (2.5 MG/3ML) 0.083% nebulizer solution Take 2.5 mg by nebulization 4 (four) times daily.       Marland Kitchen alendronate (FOSAMAX) 70 MG tablet Take 1 tablet by mouth once a week. Takes on Sundays.      Marland Kitchen aspirin EC 81 MG tablet Take 81 mg by mouth daily.      . budesonide-formoterol (SYMBICORT) 160-4.5 MCG/ACT inhaler Inhale 2 puffs into the lungs 2 (two) times daily.  3 Inhaler  1  . diltiazem (CARDIZEM CD) 120 MG 24 hr capsule Take 120 mg by mouth daily.       . furosemide (LASIX) 20 MG tablet Take 1 tablet (20 mg total) by mouth daily.  30 tablet  1  . ipratropium (ATROVENT) 0.02 % nebulizer solution Take 0.5 mg by nebulization 4 (four) times daily.       . isosorbide mononitrate (IMDUR) 60 MG 24 hr tablet Take 120 mg by mouth daily.      . meloxicam (MOBIC) 15 MG tablet Take 15 mg by mouth daily.      . nitroGLYCERIN (NITROSTAT) 0.4 MG SL tablet Place 1 tablet (0.4 mg total) under the tongue every 5 (five) minutes as needed for chest pain. Take one tablet under tongue at onset of chest pain: you may repeat every 5 minutes for up to 3 doses  90  tablet  0  . pantoprazole (PROTONIX) 40 MG tablet Take one by mouth twice daily       . potassium chloride (K-DUR) 10 MEQ tablet Take 1 tablet (10 mEq total) by mouth daily.  30 tablet  1  . simvastatin (ZOCOR) 40 MG tablet Take 40 mg by mouth at bedtime.       Scheduled: . aspirin EC  81 mg Oral Daily  . diltiazem  180 mg Oral Daily  . feeding supplement (ENSURE COMPLETE)  237 mL Oral BID BM  . furosemide  20 mg Oral Daily  . guaiFENesin  600 mg Oral BID  . heparin  5,000 Units Subcutaneous 3 times per day  . insulin aspart  0-15 Units Subcutaneous TID WC  . insulin aspart  0-5 Units Subcutaneous QHS  . insulin detemir  5 Units Subcutaneous BID  . ipratropium-albuterol  3 mL Nebulization QID  . isosorbide mononitrate  120 mg Oral Daily  . methylPREDNISolone (SOLU-MEDROL) injection  80 mg Intravenous 4 times per day  . mometasone-formoterol  2 puff Inhalation BID  . pantoprazole  40 mg Oral Daily  . senna-docusate  1 tablet Oral QHS  . simvastatin  40 mg Oral QHS  . tamsulosin  0.4 mg Oral QHS   Continuous:  MYT:RZNBVAPOLIDCV, acetaminophen, alum & mag hydroxide-simeth, diazepam, morphine CONCENTRATE, nitroGLYCERIN, ondansetron (ZOFRAN) IV, ondansetron, oxyCODONE  Assesment: He is admitted with community-acquired pneumonia and has acute on chronic respiratory failure from that. He seems to be slowly improving. I am not sure how much better he will get Principal Problem:   CAP (community acquired pneumonia) Active Problems:   HYPERTENSION   Coronary atherosclerosis of artery bypass graft   COPD with exacerbation   Pleuritic chest pain   Chronic respiratory failure with hypoxia   Generalized anxiety disorder   Urinary retention   Hyperglycemia, drug-induced    Plan: Plan is for him to be discharged to a skilled care facility. He will continue with his current treatments.    LOS: 10 days   [name] L 04/10/2014, 8:50 AM

## 2014-04-10 NOTE — Clinical Social Work Note (Signed)
CSW completed re-authorization for [facility]. PNC received as well. Spoke with MD and anticipate d/c tomorrow.   Derenda Fennel, [place] [phone]

## 2014-04-11 LAB — GLUCOSE, CAPILLARY
GLUCOSE-CAPILLARY: 152 mg/dL — AB (ref 70–99)
Glucose-Capillary: 128 mg/dL — ABNORMAL HIGH (ref 70–99)
Glucose-Capillary: 159 mg/dL — ABNORMAL HIGH (ref 70–99)
Glucose-Capillary: 147 mg/dL — ABNORMAL HIGH (ref 70–99)

## 2014-04-11 MED ORDER — ALBUTEROL SULFATE (2.5 MG/3ML) 0.083% IN NEBU
INHALATION_SOLUTION | RESPIRATORY_TRACT | Status: AC
  Filled 2014-04-11: qty 3

## 2014-04-11 MED ORDER — ALBUTEROL SULFATE (2.5 MG/3ML) 0.083% IN NEBU
2.5000 mg | INHALATION_SOLUTION | RESPIRATORY_TRACT | Status: DC | PRN
  Administered 2014-04-11: 2.5 mg via RESPIRATORY_TRACT

## 2014-04-11 NOTE — Progress Notes (Signed)
Pt family refusing medications other than pain medication and breathing treatments.  Pt family states that they only want comfort care for patient at this point.

## 2014-04-11 NOTE — Progress Notes (Signed)
Pt declining. Apnea breathing noted on non-rebreather. Family has decided to make patient as comfortable as possible and to not give anymore medications except for pain meds at this time. Order has been given to continue foley care. Will continue to monitor.

## 2014-04-11 NOTE — Progress Notes (Signed)
[facility] PROGRESS NOTE  [name] ZOX:[id number] DOB: 24-Jul-1946 DOA: 03/21/2014 PCP: Colette Ribas, MD    Code Status:  DO NOT RESUSCITATE, comfort measures Family Communication: Discussed at length with his sister Disposition Plan: Discharge to residential hospice when bed available   Consultants:  Pulmonology  Procedures:  None  Antibiotics:  Levaquin 8/21>>8/28  Brief history: The patient is a 68 year old man with a history significant for severe oxygen-dependent COPD and coronary artery disease-status post CABG who presented to the emergency department on 03/29/2014 with a complaint of chest pain and shortness of breath. In the ED, his chest x-ray revealed right upper lobe opacities, worrisome for an inflammatory process. His ABG on 3 L of oxygen revealed a pH of 7.38, PCO2 of 58, and PO2 of 101. He had difficulty urinating and a Foley catheter was placed in the ED. Flomax was added empirically. His urinalysis revealed no sign of infection. His cardiac enzymes have been negative. He was being treated with Xopenex and Atrovent nebulizers, IV>>po Levaquin, IV Solu-Medrol. Valium was restarted and titrated up to 5 mg every 6 hours as needed for anxiousness. Despite very aggressive care, patient's respiratory status failed to significantly improve. He's had increased work of breathing for several days now. He's having difficulty expectorating his secretions. He was started on some Roxanol for tachypnea which appears to have improved his symptoms mildly. He is still very hypoxic and is on a Ventimask. Overnight, his mental status did begin to deteriorate he became less responsive. At this time he is still drowsy but able to answer questions. After a long discussion with the patient's sister who is as per return he as well as the patient, it was revealed that patient has been declining over the past month. He has been on oxygen for the past 10 years, and the patient reports  that his pulmonologist told him that he had "lost 80% of his lung function". Patient appears to be aware of the severity of his disease. Despite receiving all available treatments, his respiratory status has significantly improved. We discussed further options including continuing current management versus pursuing a hospice approach while focusing on quality of life. The patient and his family are in agreement with this. They do not wish to receive any medications and is not currently related to his comfort. His medication regimen has been simplified. Currently, we're in the process of evaluating patient for residential hospice. I think he would be a good candidate for this.     Subjective: Patient is drowsy. Does not feel his breathing is any better. Having difficulty expectorating secretions  Objective: Filed Vitals:   04/11/14 1327  BP: 153/86  Pulse: 86  Temp: 97.9 F (36.6 C)  Resp: 18    Intake/Output Summary (Last 24 hours) at 04/11/14 1757 Last data filed at 04/11/14 1352  Gross per 24 hour  Intake    120 ml  Output    600 ml  Net   -480 ml   Filed Weights   04/09/14 0659 04/10/14 0457 04/11/14 0404  Weight: 60.4 kg (133 lb 2.5 oz) 62.1 kg (136 lb 14.5 oz) 64.2 kg (141 lb 8.6 oz)    Exam:   General:  Small framed frail 68 year-old man sitting in bed with increased work of breathing, on Ventimask  Cardiovascular: S1, S2, with tachycardia. 1+ generalized anasarca  Respiratory: diminished breath sounds bilaterally with rhonchi bilaterally. Chronic pursed lip breathing; barrel chest.  Abdomen: Positive bowel sounds, soft, nontender, nondistended.  Musculoskeletal: No acute hot red joints.  Neuropsychiatric: He is somewhat drowsy. Able to answer questions but keeps his eyes closed..   Data Reviewed: Basic Metabolic Panel:  Recent Labs Lab 04/06/14 1610 04/07/14 0551 04/08/14 0559 04/09/14 0618 04/10/14 0636  NA 139 140 139 141 141  K 5.1 5.0 4.4 4.7 4.9  CL  98 95* 95* 96 95*  CO2 36* 42* 39* 38* 42*  GLUCOSE 114* 127* 99 112* 121*  BUN 36* 53* 49* 52* 62*  CREATININE 0.85 1.12 0.93 0.94 0.98  CALCIUM 8.3* 8.2* 7.9* 7.7* 7.9*   Liver Function Tests:  Recent Labs Lab 04/09/14 0618  AST 14  ALT 21  ALKPHOS 51  BILITOT 0.5  PROT 4.9*  ALBUMIN 2.5*   No results found for this basename: LIPASE, AMYLASE,  in the last 168 hours No results found for this basename: AMMONIA,  in the last 168 hours CBC:  Recent Labs Lab 04/07/14 0551 04/08/14 0559  WBC 6.7 7.4  HGB 13.2 13.3  HCT 40.8 40.7  MCV 92.5 91.7  PLT 218 219   Cardiac Enzymes: No results found for this basename: CKTOTAL, CKMB, CKMBINDEX, TROPONINI,  in the last 168 hours BNP (last 3 results)  Recent Labs  03/30/2014 1522  PROBNP 1416.0*   CBG:  Recent Labs Lab 04/10/14 1716 04/10/14 1932 04/11/14 0707 04/11/14 1147 04/11/14 1614  GLUCAP 180* 155* 128* 159* 147*    No results found for this or any previous visit (from the past 240 hour(s)).   Studies: No results found.  Scheduled Meds: . feeding supplement (ENSURE COMPLETE)  237 mL Oral BID BM  . ipratropium-albuterol  3 mL Nebulization QID  . mometasone-formoterol  2 puff Inhalation BID  . pantoprazole  40 mg Oral Daily  . senna-docusate  1 tablet Oral QHS   Continuous Infusions:   Assessment and plan:  Principal Problem:   CAP (community acquired pneumonia) Active Problems:   HYPERTENSION   Coronary atherosclerosis of artery bypass graft   COPD with exacerbation   Pleuritic chest pain   Chronic respiratory failure with hypoxia   Generalized anxiety disorder   Urinary retention   Hyperglycemia, drug-induced   1. Community-acquired pneumonia. He has completed a course of Levaquin. He is now afebrile. We'll continue oxygen, Mucinex, and supportive treatment. His followup chest x-ray on 04/04/14 reveals resolution of right upper lung opacities.  Oxygen-dependent COPD with exacerbation. Patient  has been very slow to improve. He is on bronchodilators, inhaled steroids, intravenous steroids, mucolytics and flutter valve. He is on high dose IV steroids. Appreciate pulmonology assistance. Anticipate continued slow recovery. He still appears to be dyspneic at rest. When necessary morphine added for respiratory distress. I discussed the case with Dr. Juanetta Gosling, we both agree that patient may be appropriate for hospice services. I discussed this with the patient's sister who feels the patient has been declining over the last 1 month. She feels that hospice also may be appropriate for the patient. This was also discussed with the patient who is in agreement. We'll request social work to assist with hospice placement. Chronic respiratory failure with chronic hypoxia and hypercapnia. Patient's ABG on 3 L of oxygen following admission revealed a pH of 7.38, PCO2 of 58, PO2 of 101. This is likely his baseline. Pleuritic chest pain in a patient with CAD. Likely secondary to pneumonia and increase in work of breathing. His troponin I was negative x3. We'll continue analgesics as needed. Generalized anxiety disorder. He was recently weaned  off of diazepam 10 mg 4 times daily. Diazepam ordered per his request because of anxiousness and titrated to 5 mg from 2.5 mg  every 6 hours as needed. TSH is within normal limits. Urinary hesitancy/retention. Foley catheter was placed in the ED. Flomax was started. UA negative for wbcs. Will continue foley catheter for comfort. Steroid-induced hyperglycemia. On sliding scale NovoLog and Levemir. Hemoglobin A1c is 5.6. Constipation. When necessary Dulcolax suppository and laxative therapy started. Given milk and molasses enema Anasarca. Likely related to hypoalbuminemia and poor po intake. BUN continues to increase despite changing Lasix to by mouth. We'll discontinue Lasix for now   Disposition: needs SNF placement.   Time spent: 25  minutes    [facility]  [facility] Pager [phone]. If 7PM-7AM, please contact night-coverage at [email or web address], password [facility] 04/11/2014, 5:57 PM  LOS: 11 days

## 2014-04-11 NOTE — Progress Notes (Signed)
Pt is more alert this morning. He is able to tell me that he hurts and that he is at a 10 on the pain scale, but is not able to elaborate. He took swallows of water and was able to swallow his valium. Family is still at the bed side and states he seems to be doing better. His sister who is his POA states she will see a little later whether he needs to resume his scheduled medications. Will offer pt morphine at 1900 before I go off shift.

## 2014-04-11 NOTE — Progress Notes (Signed)
Subjective: He had significant deterioration in last night but looks somewhat better this morning.  Objective: Vital signs in last 24 hours: Temp:  [97.6 F (36.4 C)-97.9 F (36.6 C)] 97.6 F (36.4 C) (09/01 0357) Pulse Rate:  [105-106] 105 (08/31 1700) Resp:  [12-19] 12 (09/01 0357) BP: (133-162)/(73-85) 133/80 mmHg (09/01 0357) SpO2:  [77 %-97 %] 88 % (09/01 0654) FiO2 (%):  [55 %] 55 % (09/01 0654) Weight:  [64.2 kg (141 lb 8.6 oz)] 64.2 kg (141 lb 8.6 oz) (09/01 0404) Weight change: 2.1 kg (4 lb 10.1 oz) Last BM Date: 04/06/14  Intake/Output from previous day: 08/31 0701 - 09/01 0700 In: 360 [P.O.:360] Out: 1400 [Urine:1400]  PHYSICAL EXAM General appearance: alert and moderate distress Resp: diminished breath sounds bilaterally Cardio: regular rate and rhythm, S1, S2 normal, no murmur, click, rub or gallop GI: soft, non-tender; bowel sounds normal; no masses,  no organomegaly Extremities: extremities normal, atraumatic, no cyanosis or edema  Lab Results:  Results for orders placed during the hospital encounter of 03/27/2014 (from the past 48 hour(s))  GLUCOSE, CAPILLARY     Status: Abnormal   Collection Time    04/09/14 11:19 AM      Result Value Ref Range   Glucose-Capillary 167 (*) 70 - 99 mg/dL   Comment 1 Documented in Chart     Comment 2 Notify RN    GLUCOSE, CAPILLARY     Status: Abnormal   Collection Time    04/09/14  4:38 PM      Result Value Ref Range   Glucose-Capillary 158 (*) 70 - 99 mg/dL   Comment 1 Documented in Chart     Comment 2 Notify RN    GLUCOSE, CAPILLARY     Status: Abnormal   Collection Time    04/09/14 10:28 PM      Result Value Ref Range   Glucose-Capillary 158 (*) 70 - 99 mg/dL   Comment 1 Documented in Chart     Comment 2 Notify RN    BASIC METABOLIC PANEL     Status: Abnormal   Collection Time    04/10/14  6:36 AM      Result Value Ref Range   Sodium 141  137 - 147 mEq/L   Potassium 4.9  3.7 - 5.3 mEq/L   Chloride 95 (*) 96  - 112 mEq/L   CO2 42 (*) 19 - 32 mEq/L   Comment: CRITICAL RESULT CALLED TO, READ BACK BY AND VERIFIED WITH:     WRIGHT,M AT 7:40AM ON 04/10/14 BY FESTERMAN,C   Glucose, Bld 121 (*) 70 - 99 mg/dL   BUN 62 (*) 6 - 23 mg/dL   Creatinine, Ser 0.98  0.50 - 1.35 mg/dL   Calcium 7.9 (*) 8.4 - 10.5 mg/dL   GFR calc non Af Amer 82 (*) >90 mL/min   GFR calc Af Amer >90  >90 mL/min   Comment: (NOTE)     The eGFR has been calculated using the CKD EPI equation.     This calculation has not been validated in all clinical situations.     eGFR's persistently <90 mL/min signify possible Chronic Kidney     Disease.   Anion gap 4 (*) 5 - 15  GLUCOSE, CAPILLARY     Status: Abnormal   Collection Time    04/10/14  8:00 AM      Result Value Ref Range   Glucose-Capillary 111 (*) 70 - 99 mg/dL   Comment 1 Notify RN  GLUCOSE, CAPILLARY     Status: Abnormal   Collection Time    04/10/14 11:48 AM      Result Value Ref Range   Glucose-Capillary 151 (*) 70 - 99 mg/dL   Comment 1 Notify RN    GLUCOSE, CAPILLARY     Status: Abnormal   Collection Time    04/10/14  5:11 PM      Result Value Ref Range   Glucose-Capillary 175 (*) 70 - 99 mg/dL   Comment 1 Documented in Chart     Comment 2 Notify RN    GLUCOSE, CAPILLARY     Status: Abnormal   Collection Time    04/10/14  5:16 PM      Result Value Ref Range   Glucose-Capillary 180 (*) 70 - 99 mg/dL   Comment 1 Notify RN    GLUCOSE, CAPILLARY     Status: Abnormal   Collection Time    04/10/14  7:32 PM      Result Value Ref Range   Glucose-Capillary 155 (*) 70 - 99 mg/dL   Comment 1 Documented in Chart     Comment 2 Notify RN    GLUCOSE, CAPILLARY     Status: Abnormal   Collection Time    04/11/14  7:07 AM      Result Value Ref Range   Glucose-Capillary 128 (*) 70 - 99 mg/dL   Comment 1 Notify RN      ABGS No results found for this basename: PHART, PCO2, PO2ART, TCO2, HCO3,  in the last 72 hours CULTURES No results found for this or any  previous visit (from the past 240 hour(s)). Studies/Results: No results found.  Medications:  Prior to Admission:  Prescriptions prior to admission  Medication Sig Dispense Refill  . albuterol (PROVENTIL) (2.5 MG/3ML) 0.083% nebulizer solution Take 2.5 mg by nebulization 4 (four) times daily.       Marland Kitchen alendronate (FOSAMAX) 70 MG tablet Take 1 tablet by mouth once a week. Takes on Sundays.      Marland Kitchen aspirin EC 81 MG tablet Take 81 mg by mouth daily.      . budesonide-formoterol (SYMBICORT) 160-4.5 MCG/ACT inhaler Inhale 2 puffs into the lungs 2 (two) times daily.  3 Inhaler  1  . diltiazem (CARDIZEM CD) 120 MG 24 hr capsule Take 120 mg by mouth daily.       . furosemide (LASIX) 20 MG tablet Take 1 tablet (20 mg total) by mouth daily.  30 tablet  1  . ipratropium (ATROVENT) 0.02 % nebulizer solution Take 0.5 mg by nebulization 4 (four) times daily.       . isosorbide mononitrate (IMDUR) 60 MG 24 hr tablet Take 120 mg by mouth daily.      . meloxicam (MOBIC) 15 MG tablet Take 15 mg by mouth daily.      . nitroGLYCERIN (NITROSTAT) 0.4 MG SL tablet Place 1 tablet (0.4 mg total) under the tongue every 5 (five) minutes as needed for chest pain. Take one tablet under tongue at onset of chest pain: you may repeat every 5 minutes for up to 3 doses  90 tablet  0  . pantoprazole (PROTONIX) 40 MG tablet Take one by mouth twice daily       . potassium chloride (K-DUR) 10 MEQ tablet Take 1 tablet (10 mEq total) by mouth daily.  30 tablet  1  . simvastatin (ZOCOR) 40 MG tablet Take 40 mg by mouth at bedtime.  Scheduled: . albuterol      . aspirin EC  81 mg Oral Daily  . diltiazem  180 mg Oral Daily  . feeding supplement (ENSURE COMPLETE)  237 mL Oral BID BM  . guaiFENesin  600 mg Oral BID  . heparin  5,000 Units Subcutaneous 3 times per day  . insulin aspart  0-15 Units Subcutaneous TID WC  . insulin aspart  0-5 Units Subcutaneous QHS  . insulin detemir  5 Units Subcutaneous BID  .  ipratropium-albuterol  3 mL Nebulization QID  . isosorbide mononitrate  120 mg Oral Daily  . methylPREDNISolone (SOLU-MEDROL) injection  80 mg Intravenous 4 times per day  . mometasone-formoterol  2 puff Inhalation BID  . pantoprazole  40 mg Oral Daily  . senna-docusate  1 tablet Oral QHS  . simvastatin  40 mg Oral QHS  . tamsulosin  0.4 mg Oral QHS   Continuous:  [id number], acetaminophen, albuterol, alum & mag hydroxide-simeth, diazepam, morphine CONCENTRATE, nitroGLYCERIN, ondansetron (ZOFRAN) IV, ondansetron, oxyCODONE  Assesment: He was admitted with acute on chronic respiratory failure with community-acquired pneumonia and severe COPD. He has been slowly improving. He developed marked deterioration yesterday evening but has improved a little bit this morning. Principal Problem:   CAP (community acquired pneumonia) Active Problems:   HYPERTENSION   Coronary atherosclerosis of artery bypass graft   COPD with exacerbation   Pleuritic chest pain   Chronic respiratory failure with hypoxia   Generalized anxiety disorder   Urinary retention   Hyperglycemia, drug-induced    Plan: Continue treatments. Hospice care is being contemplated     LOS: 11 days   Paton Crum L 04/11/2014, 8:43 AM

## 2014-04-11 NOTE — Care Management Note (Addendum)
    Page 1 of 1   04/23/2014     9:27:16 AM CARE MANAGEMENT NOTE 05/05/2014  Patient:  [name],[name]   Account Number:  [id number]  Date Initiated:  04/03/2014  Documentation initiated by:  CHILDRESS,JESSICA  Subjective/Objective Assessment:   Pt is from home and independent with ADL's. Pt has home O2 from [facility] and a walker that he uses PRN. Pt has no HH services or medication needs prior to admission.     Action/Plan:   Pt plans to discharge home with self care. Pt consult will be recommended prior to discharge. Will continue to follow for CM needs.   Anticipated DC Date:  04/30/2014   Anticipated DC Plan:  HOSPICE MEDICAL FACILITY  In-house referral  Clinical Social Worker      DC Planning Services  CM consult      Choice offered to / List presented to:             Status of service:  Completed, signed off Medicare Important Message given?  YES (If response is "NO", the following Medicare IM given date fields will be blank) Date Medicare IM given:  04/07/2014 Medicare IM given by:  Kathyrn Sheriff Date Additional Medicare IM given:  05/06/2014 Additional Medicare IM given by:  JESSICA CHILDRESS  Discharge Disposition:  [facility]  Per UR Regulation:    If discussed at Long Length of Stay Meetings, dates discussed:    Comments:  04/21/2014 0930 Juanetta Beets, RN, MSN, PCCN Pt to discharge to Hospice facility today. Pt has no CM needs at this time.  04/07/2014 1200 Kathyrn Sheriff, RN, MSN, PCCN Pt plans to discharge to SNF. CSW has been consulted to make arrangements. Pt and pt's RN aware of SNF arrangments. No further CM needs identified at this time.  04/03/2014 1100 Kathyrn Sheriff, RN, MSN, [employer]

## 2014-04-11 NOTE — Progress Notes (Signed)
Pt seems more alert this am than beginning of shift. BP and heart rate stable.  O2 sat 89%. Family at bedside. Will continue to monitor.

## 2014-04-11 NOTE — Clinical Social Work Note (Signed)
Pt declined overnight. MD spoke with pt and sister and they request hospice. Per MD, pt is hospice home appropriate and cannot currently go to SNF. CSW met with pt's sister and pt's son to discuss and provide support. Pt sleeping during visit. Pt's sister, Tye [place] reports she is HCPOA. She is very familiar with hospice services and [facility] in [place] as their brother was there last fall. CSW provided packet of information from [place] to her. Discussed referral to other residential hospice facilities in [place] as [facility] is currently full. Cathy agreeable. CSW spoke with [facility], [facility] in [place], and [facility]. All are full. Explored other options in [place] with sister's permission. No return call from [facility] yet and [facility] is sending referral for MD review. CSW updated pt's sister. Will follow up with facilities in AM. Notified [employer] and [facility] with sister's permission of change in d/c plan due to decline.  Benay Pike, [place]

## 2014-04-11 DEATH — deceased

## 2014-04-12 DIAGNOSIS — J9621 Acute and chronic respiratory failure with hypoxia: Secondary | ICD-10-CM

## 2014-04-12 DIAGNOSIS — J9620 Acute and chronic respiratory failure, unspecified whether with hypoxia or hypercapnia: Secondary | ICD-10-CM

## 2014-04-13 ENCOUNTER — Ambulatory Visit: Payer: Medicare Other | Admitting: Adult Health

## 2014-05-11 NOTE — Progress Notes (Signed)
Patient's sister called RN to room.  Dr. Irene Limbo in room.  Patient has no heartbeat.  Verified by Dagoberto Ligas, RN.  [address] [facility], [place], [phone].  [facility] is requested by the patient's sister.  Support given to patient's sister, Lynden Ang.

## 2014-05-11 NOTE — Progress Notes (Signed)
PROGRESS NOTE  [name]:[id number] DOB: Nov 09, 1945 DOA: April 15, 2014 PCP: Colette Ribas, MD Pulmonologist Dr. Vassie Loll  Summary: 68 year old man with history of oxygen-dependent COPD presented with progressive worsening of shortness of breath, cough, wheezing, pleurisy, chest tightness. He was admitted for treatment of community-acquired pneumonia, acute on chronic respiratory failure with COPD exacerbation. He was treated with oxygen, steroids, antibiotics with very slow improvement initially and SNF was planned, however breathing worsened and pulmonology was consulted. He completed antibiotics and follow-up CXR revealed resolution of RUL opacities. He slowly improved but then decompensated again 8/31 becoming only semi-responsive, required high flow oxygen. Dr. Kerry Hough discussed with Dr. Juanetta Gosling and hospice was felt to be appropriate. The patient's sister/HCPOA agreed and requested comfort care and minimization of medications. Transfer to residential hospice was planned for today, but patient died prior to transfer.  Assessment/Plan: 1. Acute on chronic hypoxic and chronic hypercapnic respiratory failure secondary to advanced COPD. After discussion between patient, Billee Cashing (sister) and Dr. Kerry Hough, patient was made comfort care 9/1. 2. Community acquired pneumonia, completed antibiotic treatment. 3. COPD exacerbation. 4. Chronic hypoxic and hypercapnic respiratory failure secondary to COPD. 5. Chest pain, secondary to pneumonia. Troponins negative.  6. Steroid-induced hyperglycemia  7. Atrial fibrillation treated with ASA, diltiazem. Allergic to warfarin. 8. Urinary retention 9. H/o CAD, CABG 10. H/o tobacco abuse in remission 11. H/o anxiety on diazepam  QID prior to admission, patient was attempting wean as outpatient.   Discharge to residential hospice was planned but patient died this morning prior to transport. Condolences offered to sister Lynden Ang and niece at  bedside.  Brendia Sacks, MD  [facility]  Pager [phone] If 7PM-7AM, please contact night-coverage at [email or web address], password [facility] 05/09/2014, 10:29 AM  LOS: 12 days   Consultants:  Pulmonology  PT: SNF  Procedures:    Antibiotics:  Levaquin 8/21>>8/28   HPI/Subjective: When I walked in the room sister at bedside reported "I think he just passed".  Objective: Filed Vitals:   04/11/14 2002 04/11/14 2217 04/20/2014 0627 04/19/2014 0716  BP:  145/82 127/90   Pulse:  80 123   Temp:  98.1 F (36.7 C) 97.3 F (36.3 C)   TempSrc:  Axillary Axillary   Resp:  14 18   Height:      Weight:      SpO2: 90% 90% 90% 88%    Intake/Output Summary (Last 24 hours) at 04/20/2014 1029 Last data filed at 04/11/14 2100  Gross per 24 hour  Intake    120 ml  Output    725 ml  Net   -605 ml     Filed Weights   04/09/14 0659 04/10/14 0457 04/11/14 0404  Weight: 60.4 kg (133 lb 2.5 oz) 62.1 kg (136 lb 14.5 oz) 64.2 kg (141 lb 8.6 oz)    Exam:     Motionless. No heartbeat. No respirations.  Data Reviewed:  Chart carefully reviewed since admission  Scheduled Meds: . feeding supplement (ENSURE COMPLETE)  237 mL Oral BID BM  . ipratropium-albuterol  3 mL Nebulization QID  . mometasone-formoterol  2 puff Inhalation BID  . pantoprazole  40 mg Oral Daily  . senna-docusate  1 tablet Oral QHS   Continuous Infusions:   Principal Problem:   CAP (community acquired pneumonia) Active Problems:   HYPERTENSION   Coronary atherosclerosis of artery bypass graft   COPD with exacerbation   Pleuritic chest pain   Chronic respiratory failure with hypoxia   Generalized anxiety disorder   Urinary  retention   Hyperglycemia, drug-induced   Time spent 25 minutes

## 2014-05-11 NOTE — Discharge Summary (Signed)
Death Summary  [name] ZOX:[id number] DOB: [birth year] DOA: Apr 30, 2014  PCP: Colette Ribas, MD Pulmonologist Dr. Vassie Loll  PCP/Office Notified: 05/12/2014 at 1120 (Dr. Phillips Odor out of the office).  Admit date: 04/30/14 Date of Death: 05/12/14  Final Diagnoses:  1. Acute on chronic hypoxic respiratory failure secondary to advanced COPD 2. Advanced COPD 3. COPD exacerbation 4. Community-acquired pneumonia 5. Chronic hypoxic, hypercapnic respiratory failure secondary to COPD 6. Chest pain secondary to pneumonia 7. History of tobacco dependence in remission  History of present illness:  68 year old man with history of oxygen-dependent COPD presented with progressive worsening of shortness of breath, cough, wheezing, pleurisy, chest tightness. He was admitted for treatment of community-acquired pneumonia, acute on chronic respiratory failure with COPD exacerbation.  Hospital Course:  [name] was treated with oxygen, steroids, antibiotics with very slow improvement initially and SNF was planned, however breathing worsened and pulmonology was consulted. He completed antibiotics and follow-up CXR revealed resolution of RUL pneumonia. Despite this, improvement was very slow and he decompensated again 8/31 becoming only semi-responsive, requiring high flow oxygen. Given aggressive treatment without significant improvement, Dr. Kerry Hough discussed with Dr. Juanetta Gosling and hospice was felt to be appropriate. After discussion between Dr. Kerry Hough, the patient's sister/HCPOA and the patient, he was made comfort care. Transfer to residential hospice was planned for May 13, 2023, but patient died prior to transfer, approximately 1115 on 05/13/2023.  Time: 35 minutes  Signed:  Brendia Sacks, MD  [facility] 2014/05/12, 11:23 AM

## 2014-05-11 NOTE — Progress Notes (Signed)
PT Cancellation Note  Patient Details Name: [name] MRN: [id number] DOB: 11/28/1945   Cancelled Treatment:    Reason Eval/Treat Not Completed: Other (comment)  Reviewed chart and noted pt transitioned to comfort care/hospice care.  Pt to be discharged from acute PT services to continue with hospice care.    Maricel Swartzendruber 05/09/2014, 8:56 AM

## 2014-05-11 NOTE — Progress Notes (Signed)
Nutrition Brief Note  Chart reviewed. Pt now transitioning to comfort care.  No further nutrition interventions warranted at this time.  Please re-consult as needed.   Brittane Grudzinski A. Arshan Jabs, RD, LDN Pager: [phone]  

## 2014-05-11 NOTE — Clinical Social Work Note (Signed)
CSW present with pt's sister to provide support. Listened as she shared her feelings regarding pt's death. Immunologist.   Derenda Fennel, [place] [phone]

## 2014-05-11 NOTE — Progress Notes (Signed)
He is going to an inpatient hospice facility and I will plan to sign off at this point.  Thanks for allowing me to see him with you

## 2014-05-11 DEATH — deceased

## 2014-12-10 IMAGING — CR DG CHEST 2V
3 series · 3 of 3 positions shown · non-contrast
Comparison: 07/07/2010 chestx-ray and 06/27/2011 CT the chest.

CLINICAL DATA: History of COPD and hypertension.  Reevaluation.

CHEST - 2 VIEW

[view not recorded (1 of 3)]
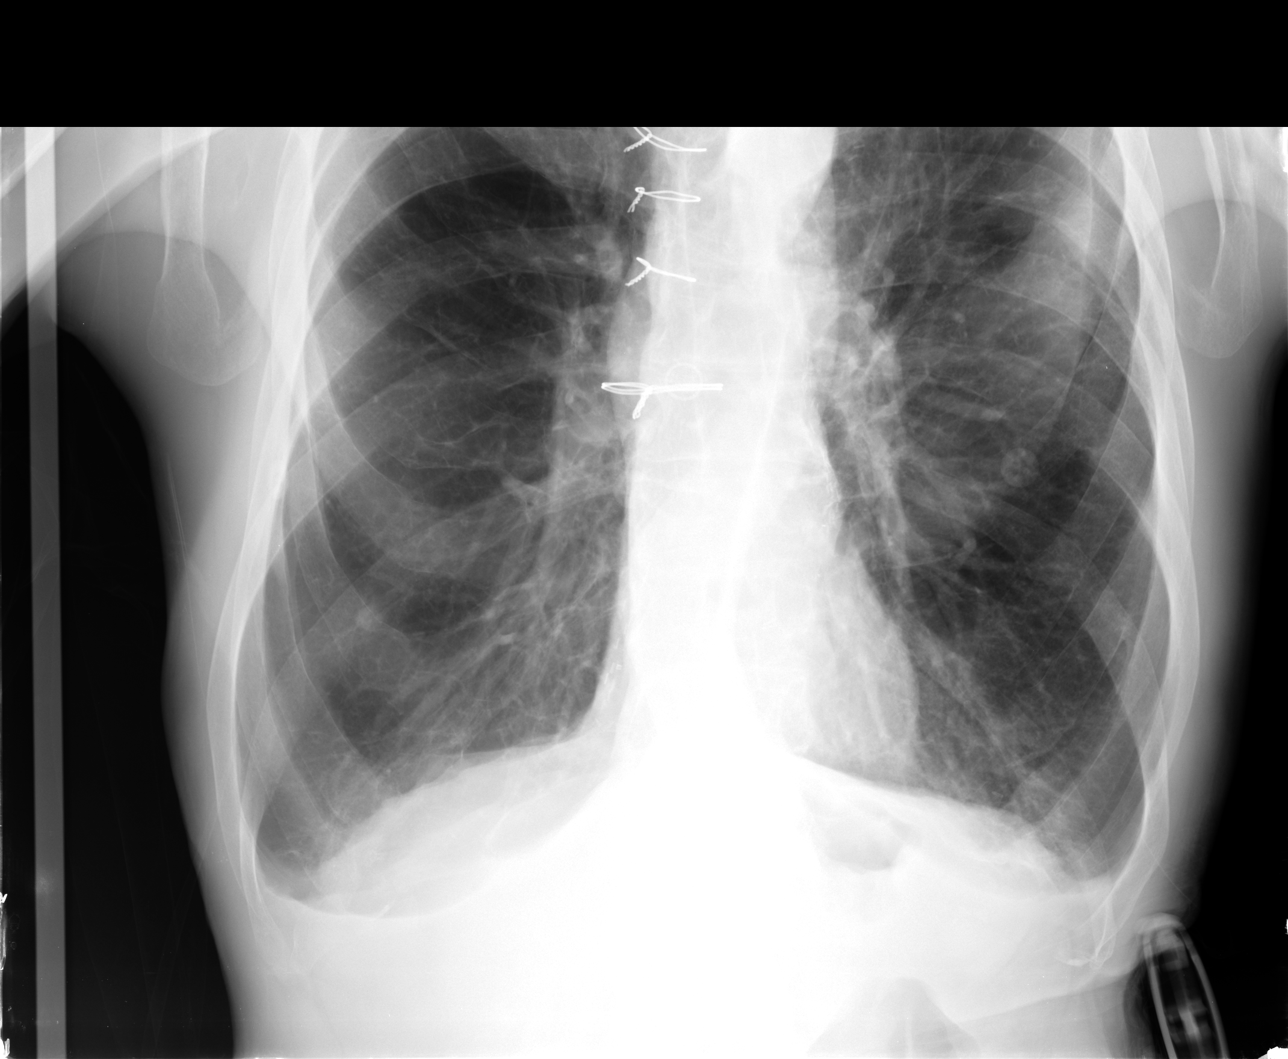

[view not recorded (2 of 3)]
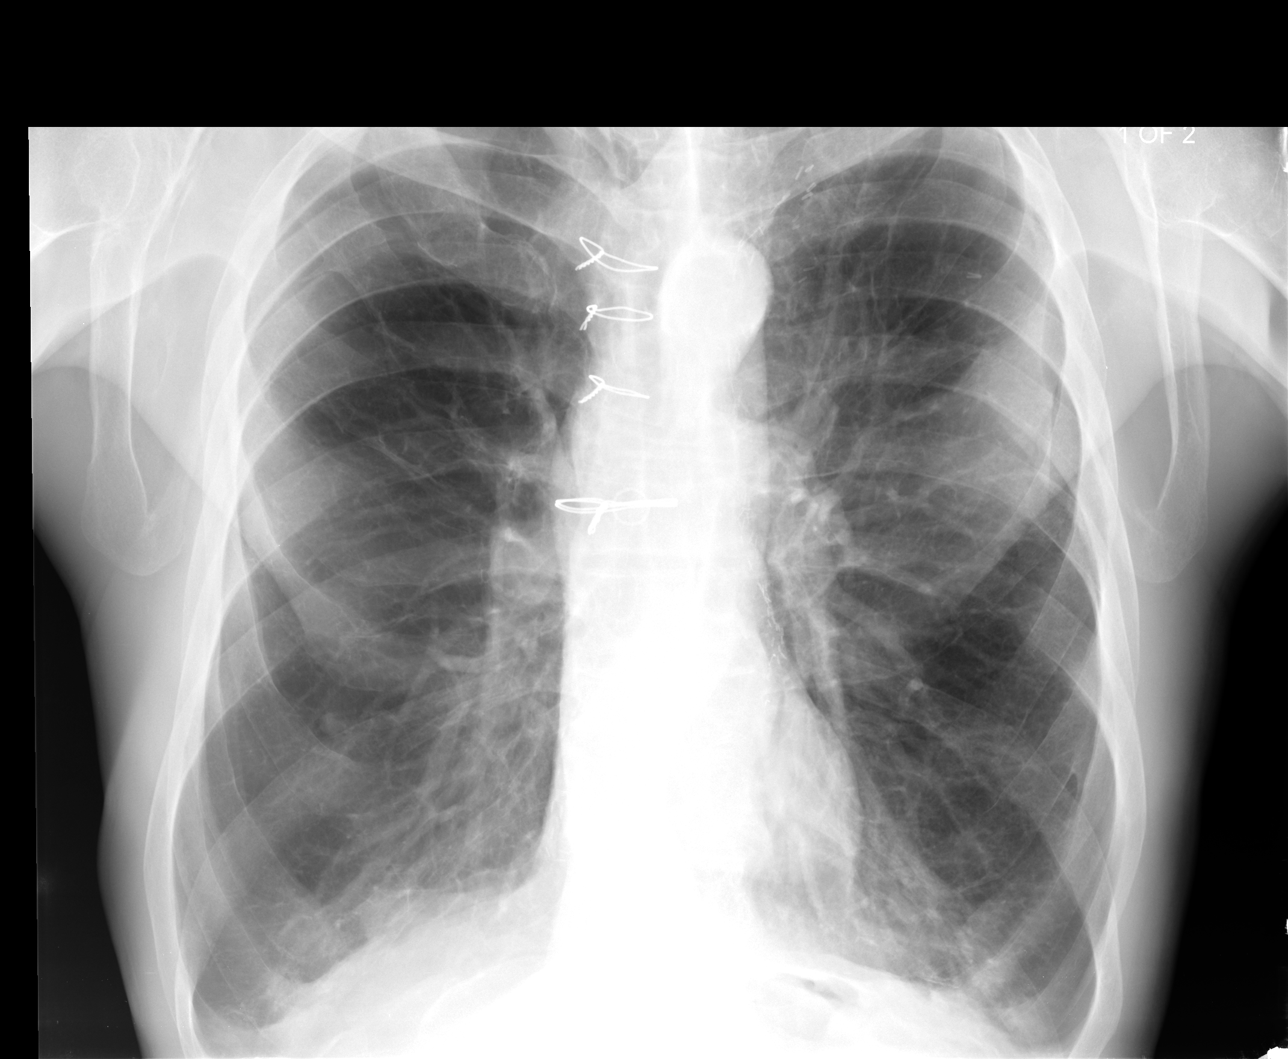

[view not recorded (3 of 3)]
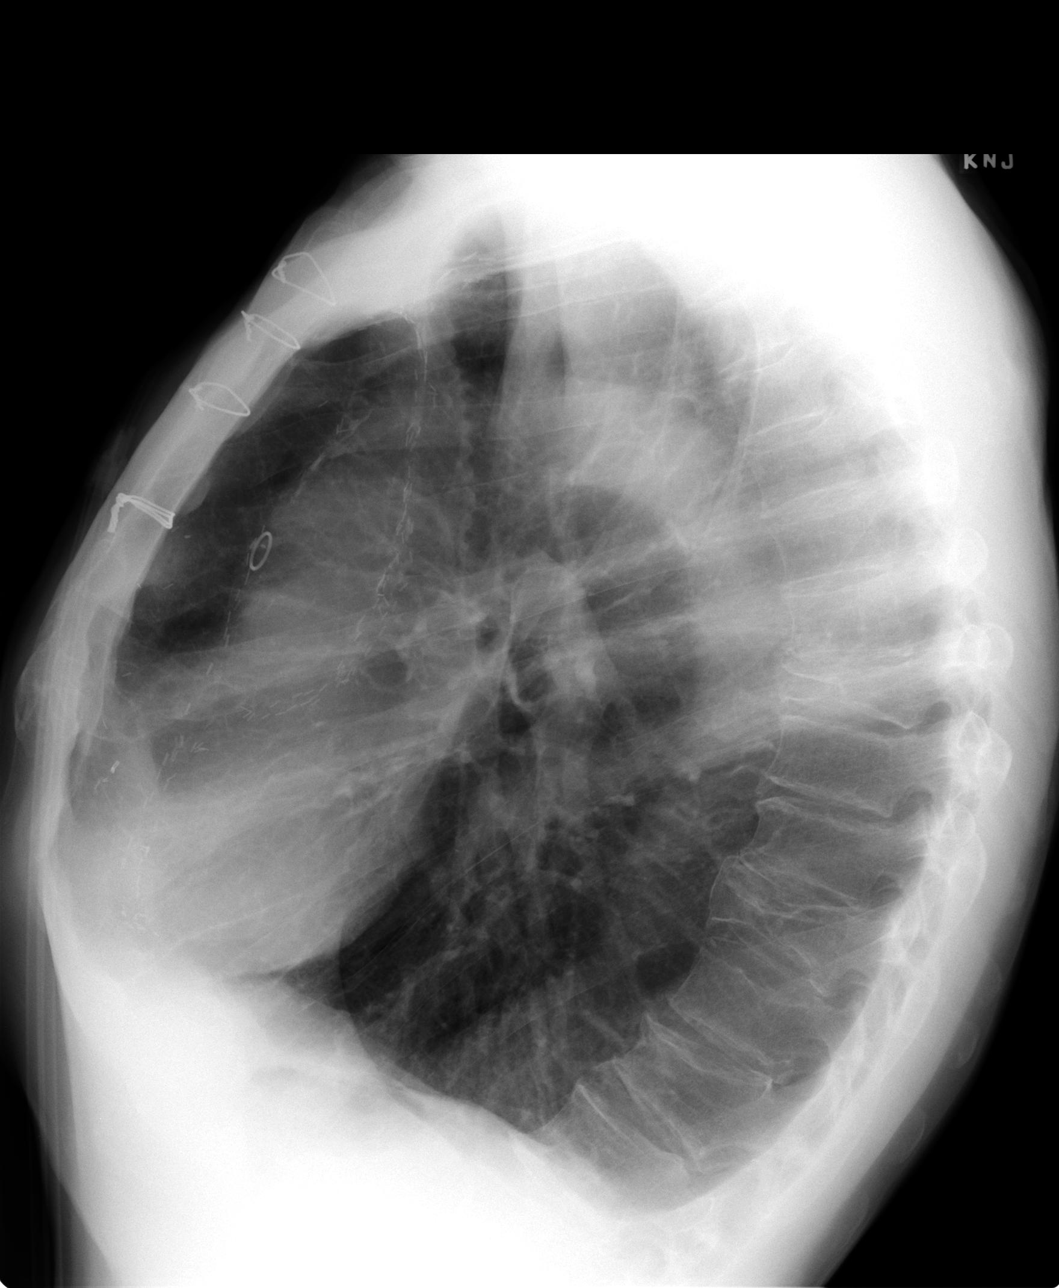

[3 of 3 positions shown; findings below may reference images not displayed]

FINDINGS: There are marked changes of COPD present with diffuse
hyperinflation. There are no plain film findings worrisome for a
developing pulmonary mass.  These changes are stable.  There has
been a previous median sternotomy.  There are no infiltrates or
edematous changes.  The heart is normal in size.  There is no
evidence for mediastinal or hilar adenopathy.  There are multiple
partially collapsed thoracic vertebra - unchanged.
IMPRESSION: 1.  COPD - unchanged.
2.  Multiple partially compressed thoracic vertebra - unchanged.
3.  No acute findings.
